# Patient Record
Sex: Male | Born: 1970 | Race: White | Hispanic: No | Marital: Married | Smoking: Current every day smoker
Health system: Southern US, Community
[De-identification: ages and names within clinical notes are randomized; demographics above are authoritative.]

## PROBLEM LIST (undated history)

## (undated) DIAGNOSIS — M549 Dorsalgia, unspecified: Secondary | ICD-10-CM

## (undated) DIAGNOSIS — R011 Cardiac murmur, unspecified: Secondary | ICD-10-CM

## (undated) HISTORY — PX: COLONOSCOPY: SHX174

---

## 2013-06-28 ENCOUNTER — Emergency Department (HOSPITAL_COMMUNITY): Payer: No Typology Code available for payment source

## 2013-06-28 ENCOUNTER — Inpatient Hospital Stay (HOSPITAL_COMMUNITY)
Admission: EM | Admit: 2013-06-28 | Discharge: 2013-07-01 | DRG: 419 | Disposition: A | Discharge status: Home / Self Care | Payer: No Typology Code available for payment source | Attending: General Surgery | Admitting: General Surgery

## 2013-06-28 ENCOUNTER — Encounter (HOSPITAL_COMMUNITY): Payer: Self-pay | Admitting: Emergency Medicine

## 2013-06-28 DIAGNOSIS — I77810 Thoracic aortic ectasia: Secondary | ICD-10-CM

## 2013-06-28 DIAGNOSIS — I341 Nonrheumatic mitral (valve) prolapse: Secondary | ICD-10-CM

## 2013-06-28 DIAGNOSIS — R110 Nausea: Secondary | ICD-10-CM

## 2013-06-28 DIAGNOSIS — R1011 Right upper quadrant pain: Secondary | ICD-10-CM

## 2013-06-28 DIAGNOSIS — R911 Solitary pulmonary nodule: Secondary | ICD-10-CM | POA: Diagnosis present

## 2013-06-28 DIAGNOSIS — F17200 Nicotine dependence, unspecified, uncomplicated: Secondary | ICD-10-CM | POA: Diagnosis present

## 2013-06-28 DIAGNOSIS — R197 Diarrhea, unspecified: Secondary | ICD-10-CM

## 2013-06-28 DIAGNOSIS — I059 Rheumatic mitral valve disease, unspecified: Secondary | ICD-10-CM | POA: Diagnosis present

## 2013-06-28 DIAGNOSIS — K810 Acute cholecystitis: Secondary | ICD-10-CM

## 2013-06-28 DIAGNOSIS — K8000 Calculus of gallbladder with acute cholecystitis without obstruction: Principal | ICD-10-CM | POA: Diagnosis present

## 2013-06-28 HISTORY — DX: Cardiac murmur, unspecified: R01.1

## 2013-06-28 HISTORY — DX: Dorsalgia, unspecified: M54.9

## 2013-06-28 LAB — COMPREHENSIVE METABOLIC PANEL
ALT: 18 U/L (ref 0–53)
AST: 16 U/L (ref 0–37)
Albumin: 4 g/dL (ref 3.5–5.2)
Alkaline Phosphatase: 105 U/L (ref 39–117)
BILIRUBIN TOTAL: 0.5 mg/dL (ref 0.3–1.2)
BUN: 11 mg/dL (ref 6–23)
CO2: 27 meq/L (ref 19–32)
CREATININE: 0.78 mg/dL (ref 0.50–1.35)
Calcium: 9.7 mg/dL (ref 8.4–10.5)
Chloride: 103 mEq/L (ref 96–112)
GFR calc Af Amer: >90 mL/min (ref >90)
GLUCOSE: 112 mg/dL — AB (ref 70–99)
Potassium: 4.1 mEq/L (ref 3.7–5.3)
Sodium: 142 mEq/L (ref 137–147)
Total Protein: 6.9 g/dL (ref 6.0–8.3)

## 2013-06-28 LAB — CBC WITH DIFFERENTIAL/PLATELET
BASOS ABS: 0.1 10*3/uL (ref 0.0–0.1)
Basophils Relative: 1 % (ref 0–1)
EOS PCT: 2 % (ref 0–5)
Eosinophils Absolute: 0.3 10*3/uL (ref 0.0–0.7)
HEMATOCRIT: 50.1 % (ref 39.0–52.0)
HEMOGLOBIN: 17.6 g/dL — AB (ref 13.0–17.0)
LYMPHS ABS: 2.5 10*3/uL (ref 0.7–4.0)
LYMPHS PCT: 20 % (ref 12–46)
MCH: 30.9 pg (ref 26.0–34.0)
MCHC: 35.1 g/dL (ref 30.0–36.0)
MCV: 88 fL (ref 78.0–100.0)
MONO ABS: 0.8 10*3/uL (ref 0.1–1.0)
Monocytes Relative: 7 % (ref 3–12)
Neutro Abs: 8.9 10*3/uL — ABNORMAL HIGH (ref 1.7–7.7)
Neutrophils Relative %: 70 % (ref 43–77)
Platelets: 283 10*3/uL (ref 150–400)
RBC: 5.69 MIL/uL (ref 4.22–5.81)
RDW: 13.8 % (ref 11.5–15.5)
WBC: 12.6 10*3/uL — AB (ref 4.0–10.5)

## 2013-06-28 LAB — I-STAT TROPONIN, ED: TROPONIN I, POC: 0 ng/mL (ref 0.00–0.08)

## 2013-06-28 LAB — LIPASE, BLOOD: Lipase: 43 U/L (ref 11–59)

## 2013-06-28 MED ORDER — MORPHINE SULFATE 4 MG/ML IJ SOLN
4.0000 mg | Freq: Once | INTRAMUSCULAR | Status: AC
  Administered 2013-06-28: 4 mg via INTRAVENOUS
  Filled 2013-06-28: qty 1

## 2013-06-28 MED ORDER — HYDROMORPHONE HCL PF 1 MG/ML IJ SOLN
1.0000 mg | Freq: Once | INTRAMUSCULAR | Status: AC
  Administered 2013-06-28: 1 mg via INTRAVENOUS
  Filled 2013-06-28: qty 1

## 2013-06-28 MED ORDER — PIPERACILLIN-TAZOBACTAM 3.375 G IVPB
3.3750 g | Freq: Three times a day (TID) | INTRAVENOUS | Status: DC
  Administered 2013-06-28 – 2013-07-01 (×9): 3.375 g via INTRAVENOUS
  Filled 2013-06-28 (×13): qty 50

## 2013-06-28 MED ORDER — HYDROMORPHONE HCL PF 1 MG/ML IJ SOLN
1.0000 mg | INTRAMUSCULAR | Status: DC | PRN
  Administered 2013-06-28 – 2013-06-30 (×12): 1 mg via INTRAVENOUS
  Filled 2013-06-28 (×12): qty 1

## 2013-06-28 MED ORDER — POTASSIUM CHLORIDE 2 MEQ/ML IV SOLN
INTRAVENOUS | Status: DC
  Administered 2013-06-28 – 2013-06-30 (×5): via INTRAVENOUS
  Filled 2013-06-28 (×11): qty 1000

## 2013-06-28 MED ORDER — SODIUM CHLORIDE 0.9 % IV BOLUS (SEPSIS)
1000.0000 mL | Freq: Once | INTRAVENOUS | Status: AC
  Administered 2013-06-28: 1000 mL via INTRAVENOUS

## 2013-06-28 MED ORDER — ONDANSETRON HCL 4 MG/2ML IJ SOLN
4.0000 mg | Freq: Four times a day (QID) | INTRAMUSCULAR | Status: DC | PRN
  Administered 2013-06-29 – 2013-06-30 (×2): 4 mg via INTRAVENOUS
  Filled 2013-06-28: qty 2

## 2013-06-28 MED ORDER — ONDANSETRON 8 MG/NS 50 ML IVPB
8.0000 mg | Freq: Once | INTRAVENOUS | Status: AC
  Administered 2013-06-28: 8 mg via INTRAVENOUS
  Filled 2013-06-28: qty 8

## 2013-06-28 MED ORDER — ONDANSETRON HCL 4 MG/2ML IJ SOLN
4.0000 mg | Freq: Once | INTRAMUSCULAR | Status: AC
  Administered 2013-06-28: 4 mg via INTRAVENOUS
  Filled 2013-06-28: qty 2

## 2013-06-28 NOTE — ED Notes (Signed)
General Surgery at bedside.

## 2013-06-28 NOTE — ED Provider Notes (Signed)
Medical screening examination/treatment/procedure(s) were performed by non-physician practitioner and as supervising physician I was immediately available for consultation/collaboration.   EKG Interpretation None        Layla MawKristen N Ward, DO 06/28/13 1553

## 2013-06-28 NOTE — ED Provider Notes (Signed)
CSN: [id number]     Arrival date & time 06/28/13  0900 History   First MD Initiated Contact with Patient 06/28/13 [phone]     Chief Complaint  Patient presents with  . Abdominal Pain     (Consider location/radiation/quality/duration/timing/severity/associated sxs/prior Treatment) HPI  Italyhad Oleta MouseLosh is a 43 y.o. male complaining of severe right upper quadrant pain onset at 5 AM woke him from sleep, + nausea with no vomiting. Patient has had several episodes of pain in this area in the last month however never this severe. Patient denies fever, chills, shortness of breath, has been passing gas and having normal stool, denies melena or hematochezia. Patient has had colonoscopy because he has had bleeding polyps in the past. States this pain may be similar to that. Last oral intake was a full meal at 11:30 PM last night.  No PCP: recently moved to town, has not yet established care.  Past Medical History  Diagnosis Date  . Heart murmur   . Back pain    History reviewed. No pertinent past surgical history. History reviewed. No pertinent family history. History  Substance Use Topics  . Smoking status: Current Every Day Smoker  . Smokeless tobacco: Not on file  . Alcohol Use: Yes    Review of Systems  10 systems reviewed and found to be negative, except as noted in the HPI.   Allergies  Review of patient's allergies indicates no known allergies.  Home Medications   Prior to Admission medications   Medication Sig Start Date End Date Taking? Authorizing Provider  gabapentin (NEURONTIN) 300 MG capsule Take 300 mg by mouth 2 (two) times daily as needed. For pain 04/07/13  Yes Historical Provider, MD  ibuprofen (ADVIL,MOTRIN) 600 MG tablet Take 600 mg by mouth 3 (three) times daily as needed. For pain 04/15/13  Yes Historical Provider, MD   BP 139/89  Pulse 74  Temp(Src) 97.9 F (36.6 C)  Resp 12  Wt 175 lb (79.379 kg)  SpO2 97% Physical Exam  Nursing note and vitals  reviewed. Constitutional: He is oriented to person, place, and time. He appears well-developed and well-nourished. He appears distressed.  HENT:  Head: Normocephalic and atraumatic.  Mouth/Throat: Oropharynx is clear and moist.  Eyes: Conjunctivae and EOM are normal. Pupils are equal, round, and reactive to light.  Neck: Normal range of motion.  Cardiovascular: Normal rate, regular rhythm and intact distal pulses.   Pulmonary/Chest: Effort normal and breath sounds normal. No stridor. No respiratory distress. He has no wheezes. He has no rales. He exhibits no tenderness.  Abdominal: Soft. He exhibits no distension and no mass. There is tenderness. There is guarding. There is no rebound.  Positive Murphy's, voluntary guarding, no rebound, reduced bowel sounds.  Musculoskeletal: Normal range of motion. He exhibits no edema.  Neurological: He is alert and oriented to person, place, and time.  Psychiatric: He has a normal mood and affect.    ED Course  Procedures (including critical care time) Labs Review Labs Reviewed  CBC WITH DIFFERENTIAL - Abnormal; Notable for the following:    WBC 12.6 (*)    Hemoglobin 17.6 (*)    Neutro Abs 8.9 (*)    All other components within normal limits  COMPREHENSIVE METABOLIC PANEL - Abnormal; Notable for the following:    Glucose, Bld 112 (*)    All other components within normal limits  LIPASE, BLOOD  I-STAT TROPOININ, ED    Imaging Review [place] Abdomen Complete  06/28/2013   CLINICAL DATA:  Abdominal pain.  EXAM: ULTRASOUND ABDOMEN COMPLETE  COMPARISON:  None.  FINDINGS: Gallbladder:  The gallbladder is distended by mid level echoes consistent with sludge. There are also admixed shadowing gallstones. No wall thickening or focal tenderness per sonographer exam.  Common bile duct:  Diameter: 6 mm.  Where visualized, no filling defect.  Liver:  No focal lesion identified. Within normal limits in parenchymal echogenicity.  IVC:  No abnormality visualized.   Pancreas:  Visualized portion unremarkable (the splenic vein does not fill with color Doppler, but neither does the neighboring aorta or left renal vein - consistent with a technical finding.  Spleen:  Upper limits of normal size with 13 cm length and 5 cm thickness. No focal abnormality.  Right Kidney:  Length: 13 cm. Echogenicity within normal limits. No mass or hydronephrosis visualized.  Left Kidney:  Length: 13 cm. Echogenicity within normal limits. No mass or hydronephrosis visualized.  Abdominal aorta:  No aneurysm visualized.  Other findings:  None.  IMPRESSION: Cholelithiasis and gallbladder distention. No focal tenderness or wall thickening to suggest acute cholecystitis.   Electronically Signed   By: Tiburcio PeaJonathan  Watts M.D.   On: 06/28/2013 11:01   Dg Chest Port 1 View  06/28/2013   CLINICAL DATA:  Shortness of breath.  EXAM: PORTABLE CHEST - 1 VIEW  COMPARISON:  None.  FINDINGS: Normal heart size and mediastinal contours. No acute infiltrate or edema. No effusion or pneumothorax. No acute osseous findings. There is mild levo curvature centered at the upper thoracic level, which may be positional.  IMPRESSION: No active disease.   Electronically Signed   By: Tiburcio PeaJonathan  Watts M.D.   On: 06/28/2013 12:22     EKG Interpretation None      MDM   Final diagnoses:  None    Filed Vitals:   06/28/13 1000 06/28/13 1115 06/28/13 1130 06/28/13 1221  BP: 149/81 144/89 144/89 139/89  Pulse: 67 61 71 74  Temp:      Resp:    12  Weight:      SpO2: 98% 100% 100% 97%    Medications  ondansetron (ZOFRAN) injection 4 mg (4 mg Intravenous Given 06/28/13 0949)  sodium chloride 0.9 % bolus 1,000 mL (0 mLs Intravenous Stopped 06/28/13 1115)  morphine 4 MG/ML injection 4 mg (4 mg Intravenous Given 06/28/13 0957)  HYDROmorphone (DILAUDID) injection 1 mg (1 mg Intravenous Given 06/28/13 1116)  ondansetron (ZOFRAN) 8 mg/NS 50 ml IVPB (8 mg Intravenous Given 06/28/13 1224)  HYDROmorphone (DILAUDID) injection 1  mg (1 mg Intravenous Given 06/28/13 1139)    Italyhad Skellenger is a 43 y.o. male presenting with severe right upper quadrant pain, had several severe exacerbations over the past few months. Likely gallbladder. Fluids, blood work and ultrasound ordered. Ultrasound shows gallstones a distended gallbladder with no wall thickening or pericholecystic fluid. Patient does have a white count. Pain is very difficult to control in the ED requiring multiple doses of Dilaudid. Surgery consult from Dr. Corliss Skainssuei appreciated: He will admit the patient's for possible surgery in the a.m. tomorrow.  Note: Portions of this report may have been transcribed using voice recognition software. Every effort was made to ensure accuracy; however, inadvertent computerized transcription errors may be present     Wynetta Emeryicole Hazelee Harbold, PA-C 06/28/13 1329

## 2013-06-28 NOTE — H&P (Signed)
Jonathan Carrillo is an 43 y.o. male.   Chief Complaint: RUQ abdominal pain, diarrhea, nausea HPI: This is a healthy 43 yo male who is in the process of transitioning to [facility] from [place], who presents with acute onset of RUQ abdominal pain, nausea, diarrhea since 0500 today.  He has had similar, but milder episodes over the last several months.  No strict relationship to eating.  This is the worst episode, by far.    Past Medical History  Diagnosis Date  . Heart murmur   . Back pain     History reviewed. No pertinent past surgical history.  History reviewed. No pertinent family history. Social History:  reports that he has been smoking.  He does not have any smokeless tobacco history on file. He reports that he drinks alcohol. His drug history is not on file.  Allergies: No Known Allergies  Prior to Admission medications   Medication Sig Start Date End Date Taking? Authorizing Provider  gabapentin (NEURONTIN) 300 MG capsule Take 300 mg by mouth 2 (two) times daily as needed. For pain 04/07/13  Yes Historical Provider, MD  ibuprofen (ADVIL,MOTRIN) 600 MG tablet Take 600 mg by mouth 3 (three) times daily as needed. For pain 04/15/13  Yes Historical Provider, MD     Results for orders placed during the hospital encounter of 06/28/13 (from the past 48 hour(s))  CBC WITH DIFFERENTIAL     Status: Abnormal   Collection Time    06/28/13  9:20 AM      Result Value Ref Range   WBC 12.6 (*) 4.0 - 10.5 K/uL   RBC 5.69  4.22 - 5.81 MIL/uL   Hemoglobin 17.6 (*) 13.0 - 17.0 g/dL   HCT 50.1  39.0 - 52.0 %   MCV 88.0  78.0 - 100.0 fL   MCH 30.9  26.0 - 34.0 pg   MCHC 35.1  30.0 - 36.0 g/dL   RDW 13.8  11.5 - 15.5 %   Platelets 283  150 - 400 K/uL   Neutrophils Relative % 70  43 - 77 %   Neutro Abs 8.9 (*) 1.7 - 7.7 K/uL   Lymphocytes Relative 20  12 - 46 %   Lymphs Abs 2.5  0.7 - 4.0 K/uL   Monocytes Relative 7  3 - 12 %   Monocytes Absolute 0.8  0.1 - 1.0 K/uL   Eosinophils Relative 2  0 -  5 %   Eosinophils Absolute 0.3  0.0 - 0.7 K/uL   Basophils Relative 1  0 - 1 %   Basophils Absolute 0.1  0.0 - 0.1 K/uL  COMPREHENSIVE METABOLIC PANEL     Status: Abnormal   Collection Time    06/28/13  9:20 AM      Result Value Ref Range   Sodium 142  137 - 147 mEq/L   Potassium 4.1  3.7 - 5.3 mEq/L   Chloride 103  96 - 112 mEq/L   CO2 27  19 - 32 mEq/L   Glucose, Bld 112 (*) 70 - 99 mg/dL   BUN 11  6 - 23 mg/dL   Creatinine, Ser 0.78  0.50 - 1.35 mg/dL   Calcium 9.7  8.4 - 10.5 mg/dL   Total Protein 6.9  6.0 - 8.3 g/dL   Albumin 4.0  3.5 - 5.2 g/dL   AST 16  0 - 37 U/L   ALT 18  0 - 53 U/L   Alkaline Phosphatase 105  39 - 117 U/L  Total Bilirubin 0.5  0.3 - 1.2 mg/dL   GFR calc non Af Amer >90  >90 mL/min   GFR calc Af Amer >90  >90 mL/min   Comment: (NOTE)     The eGFR has been calculated using the CKD EPI equation.     This calculation has not been validated in all clinical situations.     eGFR's persistently <90 mL/min signify possible Chronic Kidney     Disease.  LIPASE, BLOOD     Status: None   Collection Time    06/28/13  9:20 AM      Result Value Ref Range   Lipase 43  11 - 59 U/L  I-STAT TROPOININ, ED     Status: None   Collection Time    06/28/13 12:26 PM      Result Value Ref Range   Troponin i, poc 0.00  0.00 - 0.08 ng/mL   Comment 3            Comment: Due to the release kinetics of cTnI,     a negative result within the first hours     of the onset of symptoms does not rule out     myocardial infarction with certainty.     If myocardial infarction is still suspected,     repeat the test at appropriate intervals.   US Abdomen Complete  06/28/2013   CLINICAL DATA:  Abdominal pain.  EXAM: ULTRASOUND ABDOMEN COMPLETE  COMPARISON:  None.  FINDINGS: Gallbladder:  The gallbladder is distended by mid level echoes consistent with sludge. There are also admixed shadowing gallstones. No wall thickening or focal tenderness per sonographer exam.  Common bile duct:   Diameter: 6 mm.  Where visualized, no filling defect.  Liver:  No focal lesion identified. Within normal limits in parenchymal echogenicity.  IVC:  No abnormality visualized.  Pancreas:  Visualized portion unremarkable (the splenic vein does not fill with color Doppler, but neither does the neighboring aorta or left renal vein - consistent with a technical finding.  Spleen:  Upper limits of normal size with 13 cm length and 5 cm thickness. No focal abnormality.  Right Kidney:  Length: 13 cm. Echogenicity within normal limits. No mass or hydronephrosis visualized.  Left Kidney:  Length: 13 cm. Echogenicity within normal limits. No mass or hydronephrosis visualized.  Abdominal aorta:  No aneurysm visualized.  Other findings:  None.  IMPRESSION: Cholelithiasis and gallbladder distention. No focal tenderness or wall thickening to suggest acute cholecystitis.   Electronically Signed   By: Jorje Guild M.D.   On: 06/28/2013 11:01   Dg Chest Port 1 View  06/28/2013   CLINICAL DATA:  Shortness of breath.  EXAM: PORTABLE CHEST - 1 VIEW  COMPARISON:  None.  FINDINGS: Normal heart size and mediastinal contours. No acute infiltrate or edema. No effusion or pneumothorax. No acute osseous findings. There is mild levo curvature centered at the upper thoracic level, which may be positional.  IMPRESSION: No active disease.   Electronically Signed   By: Jorje Guild M.D.   On: 06/28/2013 12:22    Review of Systems  Constitutional: Negative for weight loss.  HENT: Negative for ear discharge, ear pain, hearing loss and tinnitus.   Eyes: Negative.  Negative for blurred vision, double vision, photophobia and pain.  Respiratory: Negative for cough, sputum production and shortness of breath.   Cardiovascular: Negative for chest pain.  Gastrointestinal: Positive for nausea, abdominal pain and diarrhea. Negative for vomiting.  Genitourinary: Negative  for dysuria, urgency, frequency and flank pain.  Musculoskeletal:  Negative for back pain, falls, joint pain, myalgias and neck pain.  Neurological: Negative.  Negative for dizziness, tingling, sensory change, focal weakness, loss of consciousness and headaches.  Endo/Heme/Allergies: Negative.  Does not bruise/bleed easily.  Psychiatric/Behavioral: Negative for depression, memory loss and substance abuse. The patient is not nervous/anxious.     Blood pressure 139/89, pulse 74, temperature 97.9 F (36.6 C), resp. rate 12, weight 175 lb (79.379 kg), SpO2 97.00%. Physical Exam  WDWN in NAD HEENT:  EOMI, sclera anicteric Neck:  No masses, no thyromegaly Lungs:  CTA bilaterally; normal respiratory effort CV:  Regular rate and rhythm; no murmurs Abd:  +bowel sounds, soft, tender RUQ just below costal margin Ext:  Well-perfused; no edema Skin:  Warm, dry; no sign of jaundice  Assessment/Plan Acute calculus cholecystitis  Admit for IV antibiotics/ IV hydration Laparoscopic cholecystectomy probably in AM.  Discussed with the patient.   Imogene Burn. Amely Voorheis 06/28/2013, 12:39 PM

## 2013-06-28 NOTE — ED Notes (Signed)
Per pt sts for the past few months he has been having RUQ pain intermittently but at 5 am he began having severe pain and nausea.

## 2013-06-29 ENCOUNTER — Observation Stay (HOSPITAL_COMMUNITY): Payer: No Typology Code available for payment source

## 2013-06-29 ENCOUNTER — Observation Stay (HOSPITAL_COMMUNITY): Payer: No Typology Code available for payment source | Admitting: Anesthesiology

## 2013-06-29 ENCOUNTER — Encounter (HOSPITAL_COMMUNITY): Payer: No Typology Code available for payment source | Admitting: Anesthesiology

## 2013-06-29 ENCOUNTER — Encounter (HOSPITAL_COMMUNITY): Payer: Self-pay | Admitting: General Surgery

## 2013-06-29 ENCOUNTER — Encounter (HOSPITAL_COMMUNITY): Admission: EM | Disposition: A | Discharge status: Home / Self Care | Payer: Self-pay | Source: Home / Self Care

## 2013-06-29 DIAGNOSIS — K8010 Calculus of gallbladder with chronic cholecystitis without obstruction: Secondary | ICD-10-CM

## 2013-06-29 HISTORY — PX: CHOLECYSTECTOMY: SHX55

## 2013-06-29 LAB — CBC
HCT: 45.1 % (ref 39.0–52.0)
HEMOGLOBIN: 15.5 g/dL (ref 13.0–17.0)
MCH: 30.5 pg (ref 26.0–34.0)
MCHC: 34.4 g/dL (ref 30.0–36.0)
MCV: 88.6 fL (ref 78.0–100.0)
Platelets: 230 10*3/uL (ref 150–400)
RBC: 5.09 MIL/uL (ref 4.22–5.81)
RDW: 13.7 % (ref 11.5–15.5)
WBC: 8.9 10*3/uL (ref 4.0–10.5)

## 2013-06-29 LAB — COMPREHENSIVE METABOLIC PANEL
ALK PHOS: 93 U/L (ref 39–117)
ALT: 16 U/L (ref 0–53)
AST: 17 U/L (ref 0–37)
Albumin: 3.4 g/dL — ABNORMAL LOW (ref 3.5–5.2)
BUN: 6 mg/dL (ref 6–23)
CO2: 26 mEq/L (ref 19–32)
Calcium: 9 mg/dL (ref 8.4–10.5)
Chloride: 103 mEq/L (ref 96–112)
Creatinine, Ser: 0.8 mg/dL (ref 0.50–1.35)
GFR calc non Af Amer: >90 mL/min (ref >90)
GLUCOSE: 104 mg/dL — AB (ref 70–99)
Potassium: 4 mEq/L (ref 3.7–5.3)
Sodium: 141 mEq/L (ref 137–147)
Total Bilirubin: 1.4 mg/dL — ABNORMAL HIGH (ref 0.3–1.2)
Total Protein: 5.9 g/dL — ABNORMAL LOW (ref 6.0–8.3)

## 2013-06-29 LAB — SURGICAL PCR SCREEN
MRSA, PCR: NEGATIVE
STAPHYLOCOCCUS AUREUS: POSITIVE — AB

## 2013-06-29 SURGERY — LAPAROSCOPIC CHOLECYSTECTOMY WITH INTRAOPERATIVE CHOLANGIOGRAM
Anesthesia: General | Site: Abdomen

## 2013-06-29 MED ORDER — BUPIVACAINE-EPINEPHRINE (PF) 0.25% -1:200000 IJ SOLN
INTRAMUSCULAR | Status: AC
  Filled 2013-06-29: qty 30

## 2013-06-29 MED ORDER — SODIUM CHLORIDE 0.9 % IR SOLN
Status: DC | PRN
  Administered 2013-06-29: 3000 mL

## 2013-06-29 MED ORDER — LIDOCAINE HCL (CARDIAC) 20 MG/ML IV SOLN
INTRAVENOUS | Status: AC
  Filled 2013-06-29: qty 5

## 2013-06-29 MED ORDER — MIDAZOLAM HCL 2 MG/2ML IJ SOLN
INTRAMUSCULAR | Status: DC | PRN
  Administered 2013-06-29: 2 mg via INTRAVENOUS

## 2013-06-29 MED ORDER — MIDAZOLAM HCL 2 MG/2ML IJ SOLN
INTRAMUSCULAR | Status: AC
  Filled 2013-06-29: qty 2

## 2013-06-29 MED ORDER — 0.9 % SODIUM CHLORIDE (POUR BTL) OPTIME
TOPICAL | Status: DC | PRN
  Administered 2013-06-29: 1000 mL

## 2013-06-29 MED ORDER — LABETALOL HCL 5 MG/ML IV SOLN
INTRAVENOUS | Status: AC
  Filled 2013-06-29: qty 4

## 2013-06-29 MED ORDER — SUCCINYLCHOLINE CHLORIDE 20 MG/ML IJ SOLN
INTRAMUSCULAR | Status: AC
  Filled 2013-06-29: qty 1

## 2013-06-29 MED ORDER — LACTATED RINGERS IV SOLN
INTRAVENOUS | Status: DC | PRN
  Administered 2013-06-29 (×2): via INTRAVENOUS

## 2013-06-29 MED ORDER — MEPERIDINE HCL 25 MG/ML IJ SOLN: 6.2500 mg | INTRAMUSCULAR | Status: DC | PRN

## 2013-06-29 MED ORDER — OXYCODONE HCL 5 MG/5ML PO SOLN: 5.0000 mg | Freq: Once | ORAL | Status: DC | PRN

## 2013-06-29 MED ORDER — PROPOFOL 10 MG/ML IV BOLUS
INTRAVENOUS | Status: DC | PRN
  Administered 2013-06-29: 200 mg via INTRAVENOUS

## 2013-06-29 MED ORDER — MUPIROCIN 2 % EX OINT
1.0000 "application " | TOPICAL_OINTMENT | Freq: Two times a day (BID) | CUTANEOUS | Status: DC
  Administered 2013-06-29 – 2013-07-01 (×4): 1 via NASAL
  Filled 2013-06-29 (×2): qty 22

## 2013-06-29 MED ORDER — IOHEXOL 300 MG/ML  SOLN
INTRAMUSCULAR | Status: DC | PRN
  Administered 2013-06-29: 13:00:00

## 2013-06-29 MED ORDER — ACETAMINOPHEN 10 MG/ML IV SOLN
1000.0000 mg | Freq: Four times a day (QID) | INTRAVENOUS | Status: AC
  Administered 2013-06-29: 1000 mg via INTRAVENOUS
  Filled 2013-06-29 (×3): qty 100

## 2013-06-29 MED ORDER — HYDROMORPHONE HCL PF 1 MG/ML IJ SOLN
INTRAMUSCULAR | Status: AC
  Filled 2013-06-29: qty 1

## 2013-06-29 MED ORDER — PROMETHAZINE HCL 25 MG/ML IJ SOLN: 6.2500 mg | INTRAMUSCULAR | Status: DC | PRN

## 2013-06-29 MED ORDER — HYDROMORPHONE HCL PF 1 MG/ML IJ SOLN
0.2500 mg | INTRAMUSCULAR | Status: DC | PRN
  Administered 2013-06-29 (×4): 0.5 mg via INTRAVENOUS

## 2013-06-29 MED ORDER — ACETAMINOPHEN 10 MG/ML IV SOLN
1000.0000 mg | Freq: Once | INTRAVENOUS | Status: DC
  Administered 2013-06-29: 1000 mg via INTRAVENOUS

## 2013-06-29 MED ORDER — ROCURONIUM BROMIDE 50 MG/5ML IV SOLN
INTRAVENOUS | Status: AC
  Filled 2013-06-29: qty 1

## 2013-06-29 MED ORDER — SODIUM CHLORIDE 0.9 % IR SOLN
Status: DC | PRN
  Administered 2013-06-29: 1000 mL

## 2013-06-29 MED ORDER — PROPOFOL 10 MG/ML IV BOLUS
INTRAVENOUS | Status: AC
  Filled 2013-06-29: qty 20

## 2013-06-29 MED ORDER — MIDAZOLAM HCL 2 MG/2ML IJ SOLN: 0.5000 mg | Freq: Once | INTRAMUSCULAR | Status: DC | PRN

## 2013-06-29 MED ORDER — CHLORHEXIDINE GLUCONATE CLOTH 2 % EX PADS
6.0000 | MEDICATED_PAD | Freq: Every day | CUTANEOUS | Status: DC
  Administered 2013-06-30 – 2013-07-01 (×2): 6 via TOPICAL

## 2013-06-29 MED ORDER — LABETALOL HCL 5 MG/ML IV SOLN
INTRAVENOUS | Status: DC | PRN
Start: 2013-06-29 — End: 2013-06-29
  Administered 2013-06-29: 5 mg via INTRAVENOUS

## 2013-06-29 MED ORDER — SUCCINYLCHOLINE CHLORIDE 20 MG/ML IJ SOLN
INTRAMUSCULAR | Status: DC | PRN
  Administered 2013-06-29: 140 mg via INTRAVENOUS

## 2013-06-29 MED ORDER — LIDOCAINE HCL (PF) 1 % IJ SOLN
INTRAMUSCULAR | Status: AC
  Filled 2013-06-29: qty 30

## 2013-06-29 MED ORDER — OXYCODONE HCL 5 MG PO TABS: 5.0000 mg | ORAL_TABLET | Freq: Once | ORAL | Status: DC | PRN

## 2013-06-29 MED ORDER — FENTANYL CITRATE 0.05 MG/ML IJ SOLN
INTRAMUSCULAR | Status: DC | PRN
  Administered 2013-06-29: 100 ug via INTRAVENOUS
  Administered 2013-06-29: 50 ug via INTRAVENOUS
  Administered 2013-06-29: 100 ug via INTRAVENOUS

## 2013-06-29 MED ORDER — FENTANYL CITRATE 0.05 MG/ML IJ SOLN
INTRAMUSCULAR | Status: AC
  Filled 2013-06-29: qty 5

## 2013-06-29 MED ORDER — ONDANSETRON HCL 4 MG/2ML IJ SOLN
INTRAMUSCULAR | Status: AC
  Filled 2013-06-29: qty 2

## 2013-06-29 MED ORDER — LIDOCAINE HCL 1 % IJ SOLN
INTRAMUSCULAR | Status: DC | PRN
  Administered 2013-06-29: 13:00:00 via INTRAMUSCULAR

## 2013-06-29 MED ORDER — ROCURONIUM BROMIDE 100 MG/10ML IV SOLN
INTRAVENOUS | Status: DC | PRN
  Administered 2013-06-29: 25 mg via INTRAVENOUS

## 2013-06-29 SURGICAL SUPPLY — 39 items
APPLIER CLIP ROT 10 11.4 M/L (STAPLE) ×6
BLADE SURG ROTATE 9660 (MISCELLANEOUS) IMPLANT
CANISTER SUCTION 2500CC (MISCELLANEOUS) ×3 IMPLANT
CHLORAPREP W/TINT 26ML (MISCELLANEOUS) ×3 IMPLANT
CLIP APPLIE ROT 10 11.4 M/L (STAPLE) ×2 IMPLANT
COVER MAYO STAND STRL (DRAPES) ×3 IMPLANT
COVER SURGICAL LIGHT HANDLE (MISCELLANEOUS) ×3 IMPLANT
DECANTER SPIKE VIAL GLASS SM (MISCELLANEOUS) ×6 IMPLANT
DERMABOND ADVANCED (GAUZE/BANDAGES/DRESSINGS) ×2
DERMABOND ADVANCED .7 DNX12 (GAUZE/BANDAGES/DRESSINGS) ×1 IMPLANT
DRAPE C-ARM 42X72 X-RAY (DRAPES) ×3 IMPLANT
DRAPE UTILITY 15X26 W/TAPE STR (DRAPE) ×6 IMPLANT
DRAPE WARM FLUID 44X44 (DRAPE) ×3 IMPLANT
ELECT REM PT RETURN 9FT ADLT (ELECTROSURGICAL) ×3
ELECTRODE REM PT RTRN 9FT ADLT (ELECTROSURGICAL) ×1 IMPLANT
FILTER SMOKE EVAC LAPAROSHD (FILTER) IMPLANT
GLOVE BIO SURGEON STRL SZ 6 (GLOVE) ×6 IMPLANT
GLOVE BIOGEL PI IND STRL 6.5 (GLOVE) ×1 IMPLANT
GLOVE BIOGEL PI INDICATOR 6.5 (GLOVE) ×2
GOWN STRL REUS W/ TWL LRG LVL3 (GOWN DISPOSABLE) ×3 IMPLANT
GOWN STRL REUS W/TWL 2XL LVL3 (GOWN DISPOSABLE) ×3 IMPLANT
GOWN STRL REUS W/TWL LRG LVL3 (GOWN DISPOSABLE) ×6
KIT BASIN OR (CUSTOM PROCEDURE TRAY) ×3 IMPLANT
KIT ROOM TURNOVER OR (KITS) ×3 IMPLANT
NS IRRIG 1000ML POUR BTL (IV SOLUTION) ×3 IMPLANT
PAD ARMBOARD 7.5X6 YLW CONV (MISCELLANEOUS) ×3 IMPLANT
POUCH SPECIMEN RETRIEVAL 10MM (ENDOMECHANICALS) ×3 IMPLANT
SCISSORS LAP 5X35 DISP (ENDOMECHANICALS) ×3 IMPLANT
SET CHOLANGIOGRAPH 5 50 .035 (SET/KITS/TRAYS/PACK) ×3 IMPLANT
SET IRRIG TUBING LAPAROSCOPIC (IRRIGATION / IRRIGATOR) ×3 IMPLANT
SLEEVE ENDOPATH XCEL 5M (ENDOMECHANICALS) ×3 IMPLANT
SPECIMEN JAR SMALL (MISCELLANEOUS) ×3 IMPLANT
SUT MNCRL AB 4-0 PS2 18 (SUTURE) ×3 IMPLANT
TOWEL OR 17X24 6PK STRL BLUE (TOWEL DISPOSABLE) ×3 IMPLANT
TOWEL OR 17X26 10 PK STRL BLUE (TOWEL DISPOSABLE) ×3 IMPLANT
TRAY LAPAROSCOPIC (CUSTOM PROCEDURE TRAY) ×3 IMPLANT
TROCAR XCEL BLUNT TIP 100MML (ENDOMECHANICALS) ×3 IMPLANT
TROCAR XCEL NON-BLD 11X100MML (ENDOMECHANICALS) ×3 IMPLANT
TROCAR XCEL NON-BLD 5MMX100MML (ENDOMECHANICALS) ×3 IMPLANT

## 2013-06-29 NOTE — Anesthesia Preprocedure Evaluation (Addendum)
Anesthesia Evaluation  Patient identified by MRN, date of birth, ID band Patient awake    Reviewed: Allergy & Precautions, H&P , NPO status , Patient's Chart, lab work & pertinent test results  History of Anesthesia Complications Negative for: history of anesthetic complications  Airway Mallampati: I TM Distance: >3 FB Neck ROM: Full    Dental  (+) Edentulous Upper, Caps, Dental Advisory Given   Pulmonary Current Smoker,  breath sounds clear to auscultation        Cardiovascular - angina+ Valvular Problems/Murmurs (remote ECHO) MVP Rhythm:Regular Rate:Normal + Systolic murmurs (III/VI)    Neuro/Psych Chronic back pain: h/o narcotics, not presently    GI/Hepatic Neg liver ROS, GERD-  Poorly Controlled,Acute chole   Endo/Other  negative endocrine ROS  Renal/GU negative Renal ROS     Musculoskeletal   Abdominal   Peds  Hematology negative hematology ROS (+)   Anesthesia Other Findings   Reproductive/Obstetrics                        Anesthesia Physical Anesthesia Plan  ASA: II  Anesthesia Plan: General   Post-op Pain Management:    Induction: Intravenous  Airway Management Planned: Oral ETT  Additional Equipment:   Intra-op Plan:   Post-operative Plan: Extubation in OR  Informed Consent: I have reviewed the patients History and Physical, chart, labs and discussed the procedure including the risks, benefits and alternatives for the proposed anesthesia with the patient or authorized representative who has indicated his/her understanding and acceptance.   Dental advisory given  Plan Discussed with: CRNA and Surgeon  Anesthesia Plan Comments: (Plan routine monitors, GETA)        Anesthesia Quick Evaluation

## 2013-06-29 NOTE — Anesthesia Procedure Notes (Signed)
Procedure Name: Intubation Date/Time: 06/29/2013 12:00 PM Performed by: Alanda AmassFRIEDMAN, Andrea Ferrer A Pre-anesthesia Checklist: Patient identified, Timeout performed, Emergency Drugs available, Patient being monitored and Suction available Patient Re-evaluated:Patient Re-evaluated prior to inductionOxygen Delivery Method: Circle system utilized Preoxygenation: Pre-oxygenation with 100% oxygen Intubation Type: IV induction, Rapid sequence and Cricoid Pressure applied Laryngoscope Size: Mac and 3 Grade View: Grade I Tube type: Oral Tube size: 7.5 mm Number of attempts: 1 Airway Equipment and Method: Stylet Placement Confirmation: ETT inserted through vocal cords under direct vision,  breath sounds checked- equal and bilateral and positive ETCO2 Secured at: 21 cm Tube secured with: Tape Dental Injury: Teeth and Oropharynx as per pre-operative assessment

## 2013-06-29 NOTE — Anesthesia Postprocedure Evaluation (Signed)
  Anesthesia Post-op Note  Patient: Jonathan Carrillo  Procedure(s) Performed: Procedure(s): LAPAROSCOPIC CHOLECYSTECTOMY WITH POSSIBLE INTRAOPERATIVE CHOLANGIOGRAM (N/A)  Patient Location: PACU  Anesthesia Type:General  Level of Consciousness: awake, alert , oriented and patient cooperative  Airway and Oxygen Therapy: Patient Spontanous Breathing  Post-op Pain: mild  Post-op Assessment: Post-op Vital signs reviewed, Patient's Cardiovascular Status Stable, Respiratory Function Stable, Patent Airway, No signs of Nausea or vomiting and Pain level controlled  Post-op Vital Signs: Reviewed and stable  Last Vitals:  Filed Vitals:   06/29/13 1014  BP: 113/74  Pulse: 60  Temp: 36.6 C  Resp: 16    Complications: No apparent anesthesia complications

## 2013-06-29 NOTE — Op Note (Signed)
Laparoscopic Cholecystectomy with IOC Procedure Note  Indications: This patient presents with acute cholecystitis and will undergo laparoscopic cholecystectomy.  Pre-operative Diagnosis: acute cholecystitis with calculus  Post-operative Diagnosis: Same  Surgeon: Almond LintBYERLY,Effie Wahlert   Assistants: CORNETT, TOM  Anesthesia: General endotracheal anesthesia and local  ASA Class: 2  Procedure Details  The patient was seen again in the Holding Room. The risks, benefits, complications, treatment options, and expected outcomes were discussed with the patient. The possibilities of  bleeding, recurrent infection, damage to nearby structures, the need for additional procedures, failure to diagnose a condition, the possible need to convert to an open procedure, and creating a complication requiring transfusion or operation were discussed with the patient. The likelihood of improving the patient's symptoms with return to their baseline status is good.    The patient and/or family concurred with the proposed plan, giving informed consent. The site of surgery properly noted. The patient was taken to Operating Room, and the procedure verified as Laparoscopic Cholecystectomy with Intraoperative Cholangiogram. A Time Out was held and the above information confirmed.  Prior to the induction of general anesthesia, antibiotic prophylaxis was administered. General endotracheal anesthesia was then administered and tolerated well. After the induction, the abdomen was prepped with Chloraprep and draped in the sterile fashion. The patient was positioned in the supine position.  Local anesthetic agent was injected into the skin near the umbilicus and an incision made. We dissected down to the abdominal fascia with blunt dissection.  The fascia was incised vertically and we entered the peritoneal cavity bluntly.  A pursestring suture of 0-Vicryl was placed around the fascial opening.  The Hasson cannula was inserted and secured  with the stay suture.  Pneumoperitoneum was then created with CO2 and tolerated well without any adverse changes in the patient's vital signs. An 11-mm port was placed in the subxiphoid position.  Two 5-mm ports were placed in the right upper quadrant. All skin incisions were infiltrated with a local anesthetic agent before making the incision and placing the trocars.   We positioned the patient in reverse Trendelenburg, tilted slightly to the patient's left.  The gallbladder was identified, the fundus grasped and retracted cephalad. Adhesions were lysed bluntly and with the electrocautery where indicated, taking care not to injure any adjacent organs or viscus. The infundibulum was grasped and retracted laterally, exposing the peritoneum overlying the triangle of Calot. This was then divided and exposed in a blunt fashion. A critical view of the cystic duct and cystic artery was obtained.  The cystic duct was clearly identified and bluntly dissected circumferentially. The cystic duct was ligated with a clip distally.   An incision was made in the cystic duct and the [facility] chol[facility] catheter introduced. The catheter was secured using a clip. A cholangiogram was then performed, demonstrating good filling of left and right hepatic ducts and no filling defects going into the duodenum.  The cystic duct was then ligated with clips and divided. The cystic artery was identified, dissected free, ligated with clips and divided as well.   The gallbladder was dissected from the liver bed in retrograde fashion with the electrocautery. The gallbladder was removed and placed in an Endocatch bag.  The gallbladder and Endocatch bag were then removed through the umbilical port site.  The liver bed was irrigated and inspected. There were numerous bridging vessels between the gallbladder and the liver.  There was significant oozing that was controlled with the cautery.  SNOW hemostatic agent was placed in the gallbladder  fossa.   Copious irrigation was utilized and was repeatedly aspirated until clear.    We again inspected the right upper quadrant for hemostasis.  Pneumoperitoneum was released as we removed the trocars.   The pursestring suture was used to close the umbilical fascia.  4-0 Monocryl was used to close the skin.   The skin was cleaned and dry, and Dermabond was applied. The patient was then extubated and brought to the recovery room in stable condition. Instrument, sponge, and needle counts were correct at closure and at the conclusion of the case.   Findings: Large dilated gallbladder.  Very thick bile.  Acutely inflamed.    Estimated Blood Loss: 100         Drains: none          Specimens: Gallbladder to pathology       Complications: None; patient tolerated the procedure well.         Disposition: PACU - hemodynamically stable.         Condition: stable

## 2013-06-29 NOTE — Progress Notes (Signed)
Day of Surgery  Subjective: NO CHANGES OVERNIGHT.  ruq PAIN AND BACK PAIN  Objective: Vital signs in last 24 hours: Temp:  [97.6 F (36.4 C)-98.1 F (36.7 C)] 97.6 F (36.4 C) (05/17 0554) Pulse Rate:  [61-80] 69 (05/17 0554) Resp:  [10-24] 18 (05/17 0554) BP: (121-149)/(70-93) 126/74 mmHg (05/17 0554) SpO2:  [95 %-100 %] 95 % (05/17 0554) Weight:  [175 lb (79.379 kg)] 175 lb (79.379 kg) (05/16 1414) Last BM Date: 06/28/13  Intake/Output from previous day: 05/16 0701 - 05/17 0700 In: 1835.4 [I.V.:835.4; IV Piggyback:1000] Out: 800 [Urine:800] Intake/Output this shift:    GI: TENDER ruq  Lab Results:   Recent Labs  06/28/13 0920 06/29/13 0454  WBC 12.6* 8.9  HGB 17.6* 15.5  HCT 50.1 45.1  PLT 283 230   BMET  Recent Labs  06/28/13 0920 06/29/13 0454  NA 142 141  K 4.1 4.0  CL 103 103  CO2 27 26  GLUCOSE 112* 104*  BUN 11 6  CREATININE 0.78 0.80  CALCIUM 9.7 9.0   PT/INR No results found for this basename: LABPROT, INR,  in the last 72 hours ABG No results found for this basename: PHART, PCO2, PO2, HCO3,  in the last 72 hours  Studies/Results: [place] Abdomen Complete  06/28/2013   CLINICAL DATA:  Abdominal pain.  EXAM: ULTRASOUND ABDOMEN COMPLETE  COMPARISON:  None.  FINDINGS: Gallbladder:  The gallbladder is distended by mid level echoes consistent with sludge. There are also admixed shadowing gallstones. No wall thickening or focal tenderness per sonographer exam.  Common bile duct:  Diameter: 6 mm.  Where visualized, no filling defect.  Liver:  No focal lesion identified. Within normal limits in parenchymal echogenicity.  IVC:  No abnormality visualized.  Pancreas:  Visualized portion unremarkable (the splenic vein does not fill with color Doppler, but neither does the neighboring aorta or left renal vein - consistent with a technical finding.  Spleen:  Upper limits of normal size with 13 cm length and 5 cm thickness. No focal abnormality.  Right Kidney:   Length: 13 cm. Echogenicity within normal limits. No mass or hydronephrosis visualized.  Left Kidney:  Length: 13 cm. Echogenicity within normal limits. No mass or hydronephrosis visualized.  Abdominal aorta:  No aneurysm visualized.  Other findings:  None.  IMPRESSION: Cholelithiasis and gallbladder distention. No focal tenderness or wall thickening to suggest acute cholecystitis.   Electronically Signed   By: Tiburcio PeaJonathan  Watts M.D.   On: 06/28/2013 11:01   Dg Chest Port 1 View  06/28/2013   CLINICAL DATA:  Shortness of breath.  EXAM: PORTABLE CHEST - 1 VIEW  COMPARISON:  None.  FINDINGS: Normal heart size and mediastinal contours. No acute infiltrate or edema. No effusion or pneumothorax. No acute osseous findings. There is mild levo curvature centered at the upper thoracic level, which may be positional.  IMPRESSION: No active disease.   Electronically Signed   By: Tiburcio PeaJonathan  Watts M.D.   On: 06/28/2013 12:22    Anti-infectives: Anti-infectives   Start     Dose/Rate Route Frequency Ordered Stop   06/28/13 1500  piperacillin-tazobactam (ZOSYN) IVPB 3.375 g     3.375 g 12.5 mL/hr over 240 Minutes Intravenous 3 times per day 06/28/13 1408        Assessment/Plan:  Acute cholecystitis Per Dr Donell BeersByerly for LGB/IOC The procedure has been discussed with the patient. Operative and non operative treatments have been discussed. Risks of surgery include bleeding, infection,  Common bile duct injury,  Injury to the stomach,liver, colon,small intestine, abdominal wall,  Diaphragm,  Major blood vessels,  And the need for an open procedure.  Other risks include worsening of medical problems, death,  DVT and pulmonary embolism, and cardiovascular events.   Medical options have also been discussed. The patient has been informed of long term expectations of surgery and non surgical options,  The patient agrees to proceed.    LOS: 1 day    Jeanmarc Viernes A. Juanetta Negash 06/29/2013

## 2013-06-29 NOTE — Interval H&P Note (Signed)
History and Physical Interval Note:  06/29/2013 11:18 AM  Jonathan Carrillo  has presented today for surgery, with the diagnosis of CHOLECYSTITIS  The various methods of treatment have been discussed with the patient and family. After consideration of risks, benefits and other options for treatment, the patient has consented to  Procedure(s): LAPAROSCOPIC CHOLECYSTECTOMY WITH POSSIBLE INTRAOPERATIVE CHOLANGIOGRAM (N/A) as a surgical intervention .  The patient's history has been reviewed, patient examined, no change in status, stable for surgery.  I have reviewed the patient's chart and labs.  Questions were answered to the patient's satisfaction.     Almond LintFaera Taje Littler

## 2013-06-29 NOTE — Transfer of Care (Signed)
Immediate Anesthesia Transfer of Care Note  Patient: Jonathan Carrillo  Procedure(s) Performed: Procedure(s): LAPAROSCOPIC CHOLECYSTECTOMY WITH POSSIBLE INTRAOPERATIVE CHOLANGIOGRAM (N/A)  Patient Location: PACU  Anesthesia Type:General  Level of Consciousness: awake  Airway & Oxygen Therapy: Patient Spontanous Breathing and Patient connected to nasal cannula oxygen  Post-op Assessment: Report given to PACU RN and Post -op Vital signs reviewed and stable  Post vital signs: Reviewed and stable  Complications: No apparent anesthesia complications

## 2013-06-30 ENCOUNTER — Encounter (HOSPITAL_COMMUNITY): Payer: Self-pay | Admitting: Cardiology

## 2013-06-30 ENCOUNTER — Observation Stay (HOSPITAL_COMMUNITY): Payer: No Typology Code available for payment source

## 2013-06-30 DIAGNOSIS — I77810 Thoracic aortic ectasia: Secondary | ICD-10-CM

## 2013-06-30 DIAGNOSIS — R011 Cardiac murmur, unspecified: Secondary | ICD-10-CM

## 2013-06-30 DIAGNOSIS — I059 Rheumatic mitral valve disease, unspecified: Secondary | ICD-10-CM

## 2013-06-30 DIAGNOSIS — K810 Acute cholecystitis: Secondary | ICD-10-CM

## 2013-06-30 DIAGNOSIS — I341 Nonrheumatic mitral (valve) prolapse: Secondary | ICD-10-CM | POA: Diagnosis present

## 2013-06-30 LAB — COMPREHENSIVE METABOLIC PANEL
ALBUMIN: 3.7 g/dL (ref 3.5–5.2)
ALT: 88 U/L — ABNORMAL HIGH (ref 0–53)
AST: 86 U/L — ABNORMAL HIGH (ref 0–37)
Alkaline Phosphatase: 119 U/L — ABNORMAL HIGH (ref 39–117)
BUN: 5 mg/dL — ABNORMAL LOW (ref 6–23)
CALCIUM: 9.3 mg/dL (ref 8.4–10.5)
CO2: 26 mEq/L (ref 19–32)
Chloride: 100 mEq/L (ref 96–112)
Creatinine, Ser: 0.73 mg/dL (ref 0.50–1.35)
GFR calc Af Amer: >90 mL/min (ref >90)
GFR calc non Af Amer: >90 mL/min (ref >90)
GLUCOSE: 120 mg/dL — AB (ref 70–99)
Potassium: 3.8 mEq/L (ref 3.7–5.3)
SODIUM: 140 meq/L (ref 137–147)
TOTAL PROTEIN: 6.6 g/dL (ref 6.0–8.3)
Total Bilirubin: 1.5 mg/dL — ABNORMAL HIGH (ref 0.3–1.2)

## 2013-06-30 LAB — CBC
HCT: 46.5 % (ref 39.0–52.0)
HEMOGLOBIN: 16.1 g/dL (ref 13.0–17.0)
MCH: 30.7 pg (ref 26.0–34.0)
MCHC: 34.6 g/dL (ref 30.0–36.0)
MCV: 88.7 fL (ref 78.0–100.0)
Platelets: 267 10*3/uL (ref 150–400)
RBC: 5.24 MIL/uL (ref 4.22–5.81)
RDW: 13.6 % (ref 11.5–15.5)
WBC: 10.2 10*3/uL (ref 4.0–10.5)

## 2013-06-30 MED ORDER — HYDROMORPHONE HCL PF 1 MG/ML IJ SOLN
1.0000 mg | Freq: Once | INTRAMUSCULAR | Status: AC
  Administered 2013-06-30: 1 mg via INTRAVENOUS
  Filled 2013-06-30: qty 1

## 2013-06-30 MED ORDER — METHOCARBAMOL 500 MG PO TABS
500.0000 mg | ORAL_TABLET | Freq: Three times a day (TID) | ORAL | Status: DC | PRN
  Administered 2013-06-30 (×2): 1000 mg via ORAL
  Administered 2013-07-01: 500 mg via ORAL
  Filled 2013-06-30: qty 1
  Filled 2013-06-30 (×2): qty 2

## 2013-06-30 MED ORDER — HYDROMORPHONE HCL PF 1 MG/ML IJ SOLN
1.0000 mg | INTRAMUSCULAR | Status: DC | PRN
  Administered 2013-06-30: 1 mg via INTRAVENOUS
  Administered 2013-06-30 (×3): 2 mg via INTRAVENOUS
  Administered 2013-06-30 (×2): 1 mg via INTRAVENOUS
  Administered 2013-07-01 (×3): 2 mg via INTRAVENOUS
  Filled 2013-06-30 (×9): qty 2

## 2013-06-30 NOTE — Progress Notes (Signed)
  Echocardiogram 2D Echocardiogram has been performed.  Jonathan Carrillo 06/30/2013, 1:07 PM

## 2013-06-30 NOTE — Progress Notes (Signed)
UR completed. Patient changed to inpatient- con't to require IVF and IV antibiotics

## 2013-06-30 NOTE — Progress Notes (Signed)
Will obtain ECHO to evaluate heart murmur.  Pain improving slowly.  Wilmon ArmsMatthew K. Corliss Skainssuei, MD, [facility]  General/ Trauma Surgery  06/30/2013 12:56 PM

## 2013-06-30 NOTE — Progress Notes (Signed)
Patient ID: [place] Eads, male   DOB: 1970-03-20, 43 y.o.   MRN: [id number] 1 Day Post-Op  Subjective: Pt having pain, had to get a shot of dilaudid just now to help.  Tolerating some liquids.  No nausea.  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  Moving here in June when his kids are out of school.  Objective: Vital signs in last 24 hours: Temp:  [97.8 F (36.6 C)-98.5 F (36.9 C)] 98.2 F (36.8 C) (05/18 0440) Pulse Rate:  [59-75] 70 (05/18 0440) Resp:  [14-24] 16 (05/18 0440) BP: (113-142)/(74-96) 139/78 mmHg (05/18 0440) SpO2:  [91 %-97 %] 96 % (05/18 0440) Last BM Date: 06/28/13  Intake/Output from previous day: 05/17 0701 - 05/18 0700 In: 6240 [P.O.:240; I.V.:6000] Out: 2140 [Urine:2120; Blood:20] Intake/Output this shift:    PE: Abd: soft, appropriately tender, +BS, ND, incisions c/d/i.  Cardiac murmur audible with auscultation to his abdomen Heart: very loud murmur noted. (patient states sometimes it even moves him) Lungs: CTAB  Lab Results:   Recent Labs  06/29/13 0454 06/30/13 0549  WBC 8.9 10.2  HGB 15.5 16.1  HCT 45.1 46.5  PLT 230 267   BMET  Recent Labs  06/29/13 0454 06/30/13 0549  NA 141 140  K 4.0 3.8  CL 103 100  CO2 26 26  GLUCOSE 104* 120*  BUN 6 5*  CREATININE 0.80 0.73  CALCIUM 9.0 9.3   PT/INR No results found for this basename: LABPROT, INR,  in the last 72 hours CMP     Component Value Date/Time   NA 140 06/30/2013 0549   K 3.8 06/30/2013 0549   CL 100 06/30/2013 0549   CO2 26 06/30/2013 0549   GLUCOSE 120* 06/30/2013 0549   BUN 5* 06/30/2013 0549   CREATININE 0.73 06/30/2013 0549   CALCIUM 9.3 06/30/2013 0549   PROT 6.6 06/30/2013 0549   ALBUMIN 3.7 06/30/2013 0549   AST 86* 06/30/2013 0549   ALT 88* 06/30/2013 0549   ALKPHOS 119* 06/30/2013 0549   BILITOT 1.5* 06/30/2013 0549   GFRNONAA >90 06/30/2013 0549   GFRAA >90 06/30/2013 0549   Lipase     Component Value Date/Time   LIPASE 43 06/28/2013 0920        Studies/Results: Dg Cholangiogram Operative  06/29/2013   CLINICAL DATA:  Acute cholecystitis  EXAM: INTRAOPERATIVE CHOLANGIOGRAM  TECHNIQUE: Cholangiographic images from the C-arm fluoroscopic device were submitted for interpretation post-operatively. Please see the procedural report for the amount of contrast and the fluoroscopy time utilized.  COMPARISON:  None.  FINDINGS: Contrast fills the biliary tree and duodenum compatible with patency. There are no filling defects in the common bile duct to suggest common bile duct stones.  IMPRESSION: Patent biliary tree.  No definite common bile duct stones.   Electronically Signed   By: Maryclare BeanArt  Hoss M.D.   On: 06/29/2013 14:20   [place] Abdomen Complete  06/28/2013   CLINICAL DATA:  Abdominal pain.  EXAM: ULTRASOUND ABDOMEN COMPLETE  COMPARISON:  None.  FINDINGS: Gallbladder:  The gallbladder is distended by mid level echoes consistent with sludge. There are also admixed shadowing gallstones. No wall thickening or focal tenderness per sonographer exam.  Common bile duct:  Diameter: 6 mm.  Where visualized, no filling defect.  Liver:  No focal lesion identified. Within normal limits in parenchymal echogenicity.  IVC:  No abnormality visualized.  Pancreas:  Visualized portion unremarkable (the splenic vein does not fill with color Doppler, but neither  does the neighboring aorta or left renal vein - consistent with a technical finding.  Spleen:  Upper limits of normal size with 13 cm length and 5 cm thickness. No focal abnormality.  Right Kidney:  Length: 13 cm. Echogenicity within normal limits. No mass or hydronephrosis visualized.  Left Kidney:  Length: 13 cm. Echogenicity within normal limits. No mass or hydronephrosis visualized.  Abdominal aorta:  No aneurysm visualized.  Other findings:  None.  IMPRESSION: Cholelithiasis and gallbladder distention. No focal tenderness or wall thickening to suggest acute cholecystitis.   Electronically Signed   By: Tiburcio PeaJonathan   Watts M.D.   On: 06/28/2013 11:01   Dg Chest Port 1 View  06/28/2013   CLINICAL DATA:  Shortness of breath.  EXAM: PORTABLE CHEST - 1 VIEW  COMPARISON:  None.  FINDINGS: Normal heart size and mediastinal contours. No acute infiltrate or edema. No effusion or pneumothorax. No acute osseous findings. There is mild levo curvature centered at the upper thoracic level, which may be positional.  IMPRESSION: No active disease.   Electronically Signed   By: Tiburcio PeaJonathan  Watts M.D.   On: 06/28/2013 12:22    Anti-infectives: Anti-infectives   Start     Dose/Rate Route Frequency Ordered Stop   06/28/13 1500  piperacillin-tazobactam (ZOSYN) IVPB 3.375 g     3.375 g 12.5 mL/hr over 240 Minutes Intravenous 3 times per day 06/28/13 1408         Assessment/Plan  1. Acute cholecystitis, s/p lap chole POD 1 2. H/o lung nodule 3. Cardiac murmur  Plan: 1. Patient is scheduled to get a 2 view CXR this morning to follow up on his lung nodule 2. He also had a 2D echo ordered.  This murmur is so loud that I would be surprised if MVP is the only source that is causing this. 3. Cont to advance diet as tolerates.  Patient still on zosyn  4. Decrease IVFs to 50cc/hr  LOS: 2 days    Letha CapeKelly E Estrellita Lasky 06/30/2013, 8:14 AM Pager: [phone]

## 2013-06-30 NOTE — Consult Note (Signed)
Reason for Consult: murmur with abnormal Echo   Referring Physician: CCS  No PCP Per Patient Primary Cardiologist:New Dr. Hilty  Jonathan Carrillo is an 43 y.o. male.    Chief Complaint:    HPI: 43 yo male who is in the process of transitioning to [place] from [place], [place], who presented 06/28/13 with acute onset of RUQ abdominal pain, nausea, diarrhea since 0500 that day. He has had similar, but milder episodes over the last several months. No strict relationship to eating. This was the worst episode, by far.  He underwent Lap chole yesterday at 1300 for acute cholecystitis.   It was noted on admit of a loud murmur echo was ordered. MVP see echo report.   EKG SR prob RVH.  Pt has no chest pain, occ palpitations at times.  Past Medical History  Diagnosis Date  . Heart murmur   . Back pain     History reviewed. No pertinent past surgical history.  Family History  Problem Relation Age of Onset  . Clotting disorder Mother   . Heart disease Mother   . Heart disease Sister   . Diabetes Sister    Social History:  reports that he has been smoking.  He does not have any smokeless tobacco history on file. He reports that he drinks alcohol. His drug history is not on file.  Allergies: No Known Allergies  Medications Prior to Admission  Medication Sig Dispense Refill  . gabapentin (NEURONTIN) 300 MG capsule Take 300 mg by mouth 2 (two) times daily as needed. For pain      . ibuprofen (ADVIL,MOTRIN) 600 MG tablet Take 600 mg by mouth 3 (three) times daily as needed. For pain        Results for orders placed during the hospital encounter of 06/28/13 (from the past 48 hour(s))  COMPREHENSIVE METABOLIC PANEL     Status: Abnormal   Collection Time    06/29/13  4:54 AM      Result Value Ref Range   Sodium 141  137 - 147 mEq/L   Potassium 4.0  3.7 - 5.3 mEq/L   Chloride 103  96 - 112 mEq/L   CO2 26  19 - 32 mEq/L   Glucose, Bld 104 (*) 70 - 99 mg/dL   BUN 6  6 - 23 mg/dL   Creatinine, Ser 0.80  0.50 - 1.35 mg/dL   Calcium 9.0  8.4 - 10.5 mg/dL   Total Protein 5.9 (*) 6.0 - 8.3 g/dL   Albumin 3.4 (*) 3.5 - 5.2 g/dL   AST 17  0 - 37 U/L   ALT 16  0 - 53 U/L   Alkaline Phosphatase 93  39 - 117 U/L   Total Bilirubin 1.4 (*) 0.3 - 1.2 mg/dL   GFR calc non Af Amer >90  >90 mL/min   GFR calc Af Amer >90  >90 mL/min   Comment: (NOTE)     The eGFR has been calculated using the CKD EPI equation.     This calculation has not been validated in all clinical situations.     eGFR's persistently <90 mL/min signify possible Chronic Kidney     Disease.  CBC     Status: None   Collection Time    06/29/13  4:54 AM      Result Value Ref Range   WBC 8.9  4.0 - 10.5 K/uL   RBC 5.09  4.22 - 5.81 MIL/uL   Hemoglobin  15.5  13.0 - 17.0 g/dL   HCT 45.1  39.0 - 52.0 %   MCV 88.6  78.0 - 100.0 fL   MCH 30.5  26.0 - 34.0 pg   MCHC 34.4  30.0 - 36.0 g/dL   RDW 13.7  11.5 - 15.5 %   Platelets 230  150 - 400 K/uL  SURGICAL PCR SCREEN     Status: Abnormal   Collection Time    06/29/13 10:36 AM      Result Value Ref Range   MRSA, PCR NEGATIVE  NEGATIVE   Staphylococcus aureus POSITIVE (*) NEGATIVE   Comment:            The Xpert SA Assay (FDA     approved for NASAL specimens     in patients over 14 years of age),     is one component of     a comprehensive surveillance     program.  Test performance has     been validated by [employer] for patients greater     than or equal to 68 year old.     It is not intended     to diagnose infection nor to     guide or monitor treatment.  CBC     Status: None   Collection Time    06/30/13  5:49 AM      Result Value Ref Range   WBC 10.2  4.0 - 10.5 K/uL   RBC 5.24  4.22 - 5.81 MIL/uL   Hemoglobin 16.1  13.0 - 17.0 g/dL   HCT 46.5  39.0 - 52.0 %   MCV 88.7  78.0 - 100.0 fL   MCH 30.7  26.0 - 34.0 pg   MCHC 34.6  30.0 - 36.0 g/dL   RDW 13.6  11.5 - 15.5 %   Platelets 267  150 - 400 K/uL  COMPREHENSIVE METABOLIC PANEL      Status: Abnormal   Collection Time    06/30/13  5:49 AM      Result Value Ref Range   Sodium 140  137 - 147 mEq/L   Potassium 3.8  3.7 - 5.3 mEq/L   Chloride 100  96 - 112 mEq/L   CO2 26  19 - 32 mEq/L   Glucose, Bld 120 (*) 70 - 99 mg/dL   BUN 5 (*) 6 - 23 mg/dL   Creatinine, Ser 0.73  0.50 - 1.35 mg/dL   Calcium 9.3  8.4 - 10.5 mg/dL   Total Protein 6.6  6.0 - 8.3 g/dL   Albumin 3.7  3.5 - 5.2 g/dL   AST 86 (*) 0 - 37 U/L   ALT 88 (*) 0 - 53 U/L   Alkaline Phosphatase 119 (*) 39 - 117 U/L   Total Bilirubin 1.5 (*) 0.3 - 1.2 mg/dL   GFR calc non Af Amer >90  >90 mL/min   GFR calc Af Amer >90  >90 mL/min   Comment: (NOTE)     The eGFR has been calculated using the CKD EPI equation.     This calculation has not been validated in all clinical situations.     eGFR's persistently <90 mL/min signify possible Chronic Kidney     Disease.   Dg Chest 2 View  06/30/2013   CLINICAL DATA:  History of spot on lung.  EXAM: CHEST  2 VIEW  COMPARISON:  06/28/2013  FINDINGS: Lungs are adequately inflated without focal consolidation or effusion. There is no  definite pulmonary nodule/mass. Cardiomediastinal silhouette is within normal. There are surgical clips over the right upper quadrant. Remainder of the exam is unremarkable.  IMPRESSION: No active cardiopulmonary disease.   Electronically Signed   By: Marin Olp M.D.   On: 06/30/2013 08:22   Dg Cholangiogram Operative  06/29/2013   CLINICAL DATA:  Acute cholecystitis  EXAM: INTRAOPERATIVE CHOLANGIOGRAM  TECHNIQUE: Cholangiographic images from the C-arm fluoroscopic device were submitted for interpretation post-operatively. Please see the procedural report for the amount of contrast and the fluoroscopy time utilized.  COMPARISON:  None.  FINDINGS: Contrast fills the biliary tree and duodenum compatible with patency. There are no filling defects in the common bile duct to suggest common bile duct stones.  IMPRESSION: Patent biliary tree.  No definite  common bile duct stones.   Electronically Signed   By: Maryclare Bean M.D.   On: 06/29/2013 14:20   2 D Echo: Study Conclusions  - Left ventricle: The cavity size was normal. Wall thickness was increased in a pattern of mild LVH. Systolic function was normal. The estimated ejection fraction was in the range of 50% to 55%. Wall motion was normal; there were no regional wall motion abnormalities. Doppler parameters are consistent with abnormal left ventricular relaxation (grade 1 diastolic dysfunction). - Aortic valve: Trileaflet. There was no significant regurgitation. - Aorta: Appears to be some effacement of the sinotubular junction, although aorta not well seen. Aortic root dimension: 4.5 cm (ED). - Aortic root: The aortic root was moderately dilated. - Mitral valve: Mildly to moderately thickened leaflets. Moderate bileaflet prolapse, anterior greater than posterior. There was at least mild regurgitation directed posteriorly. Not well seen. - Tricuspid valve: There was trivial regurgitation. - Pulmonary arteries: Systolic pressure could not be accurately estimated. - Inferior vena cava: Not well visualized. Unable to estimate CVP. - Pericardium, extracardiac: There was no pericardial effusion.  Impressions:  - Mild LVH with LVEF 25-05%, grade 1 diastolic dysfunction. Mitral leaflets are mildly to moderately thickened and there is moderate bileaflet prolapse, anterior greater than posterior. This is associated with at least mild, posteriorly directed mitral regurgitation, although not well defined overall. A TEE could be considered for further evaluation, if felt to be clinically necessary. There is also moderate dilatation of the aortic root with some effacement of the sinotubular junction. Extent of the aorta is not well seen. Suggest further directed aortic imaging such as CTA or MRA. Trivial tricuspid regurgitation, unable to assess PASP.      ROS: ROS  General:no colds or  fevers, no weight changes Skin:no rashes or ulcers HEENT:no blurred vision, no congestion CV:see HPI PUL:see HPI GI:+ diarrhea, nausea and vomiting on admit no constipation or melena, no indigestion GU:no hematuria, no dysuria MS:no joint pain, no claudication Neuro:no syncope, no lightheadedness Endo:no diabetes, no thyroid disease  Blood pressure 131/74, pulse 83, temperature 98.2 F (36.8 C), temperature source Oral, resp. rate 18, height 6' 1"  (1.854 m), weight 175 lb (79.379 kg), SpO2 86.00%. PE: General:Pleasant affect, NAD Skin:Warm and dry, brisk capillary refill HEENT:normocephalic, sclera clear, mucus membranes moist Neck:supple, no JVD, no bruits  Heart:S1S2 RRR [id number]/4 systolic mitral murmur with radiation to back and laterally, no gallup, rub or click Lungs:clear without rales, rhonchi, or wheezes LPF:XTKW, + tender at incision site, hypoactive BS, do not palpate liver spleen or masses Ext:no lower ext edema, 2+ pedal pulses, 2+ radial pulses Neuro:alert and oriented, MAE, follows commands, + facial symmetry    Assessment/Plan Active Problems:   Acute cholecystitis,  s/p lap chole   MVP (mitral valve prolapse)- MD to see for further recommendations.     Cecilie Kicks  Nurse Practitioner Certified [place] Pager [phone] or after 5pm or weekends call [phone] 06/30/2013, 9:38 PM

## 2013-06-30 NOTE — Consult Note (Signed)
Pt. Seen and examined. Agree with the NP/PA-C note as written.  43 yo male with recent acute cholecystitis s/p lap chole. He has a loud murmur and an echo shows mild mitral regurgitation and bileaflet mitral valve prolapse. There is effacement of the sinotubular junction and the aortic root is mildly dilated, however, no significant AI is reported. He tolerated surgery without complications.  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 He has dislocated patellas in the past as well - his wrist and elbow joints are hyperextensible. This is certainly concerning for connective tissue disorder.  I would recommend a baseline CT Aortogram to evaluate the extent of his aortopathy prior to discharge. If there are no clear surgical indications, he could follow-up with me in the office as an outpatient. He is planning on a permanent move to [place].  Thanks for the consult.  Chrystie NoseKenneth C. Veer Elamin, MD, [facility] Attending Cardiologist [facility]

## 2013-07-01 ENCOUNTER — Inpatient Hospital Stay (HOSPITAL_COMMUNITY): Payer: No Typology Code available for payment source

## 2013-07-01 ENCOUNTER — Encounter (HOSPITAL_COMMUNITY): Payer: Self-pay | Admitting: Radiology

## 2013-07-01 LAB — COMPREHENSIVE METABOLIC PANEL
ALK PHOS: 117 U/L (ref 39–117)
ALT: 89 U/L — AB (ref 0–53)
AST: 50 U/L — AB (ref 0–37)
Albumin: 3.5 g/dL (ref 3.5–5.2)
BILIRUBIN TOTAL: 1.3 mg/dL — AB (ref 0.3–1.2)
BUN: 5 mg/dL — ABNORMAL LOW (ref 6–23)
CHLORIDE: 98 meq/L (ref 96–112)
CO2: 27 meq/L (ref 19–32)
Calcium: 9.2 mg/dL (ref 8.4–10.5)
Creatinine, Ser: 0.68 mg/dL (ref 0.50–1.35)
GFR calc Af Amer: >90 mL/min (ref >90)
GLUCOSE: 102 mg/dL — AB (ref 70–99)
Potassium: 4.2 mEq/L (ref 3.7–5.3)
SODIUM: 137 meq/L (ref 137–147)
Total Protein: 6.6 g/dL (ref 6.0–8.3)

## 2013-07-01 MED ORDER — KETOROLAC TROMETHAMINE 30 MG/ML IJ SOLN
30.0000 mg | Freq: Three times a day (TID) | INTRAMUSCULAR | Status: DC | PRN
  Administered 2013-07-01 (×2): 30 mg via INTRAVENOUS
  Filled 2013-07-01 (×2): qty 1

## 2013-07-01 MED ORDER — OXYCODONE-ACETAMINOPHEN 5-325 MG PO TABS: 1.0000 | ORAL_TABLET | ORAL | Status: DC | PRN

## 2013-07-01 MED ORDER — AMOXICILLIN-POT CLAVULANATE 875-125 MG PO TABS
1.0000 | ORAL_TABLET | Freq: Two times a day (BID) | ORAL | Status: DC
  Administered 2013-07-01: 1 via ORAL
  Filled 2013-07-01: qty 1

## 2013-07-01 MED ORDER — OXYCODONE HCL 5 MG PO TABS
5.0000 mg | ORAL_TABLET | ORAL | Status: DC | PRN
  Administered 2013-07-01 (×2): 10 mg via ORAL
  Filled 2013-07-01 (×2): qty 2

## 2013-07-01 MED ORDER — OXYCODONE HCL 5 MG PO TABS: 5.0000 mg | ORAL_TABLET | ORAL | Status: DC | PRN

## 2013-07-01 MED ORDER — IOHEXOL 350 MG/ML SOLN
100.0000 mL | Freq: Once | INTRAVENOUS | Status: AC | PRN
Start: 2013-07-01 — End: 2013-07-01
  Administered 2013-07-01: 100 mL via INTRAVENOUS

## 2013-07-01 MED ORDER — AMOXICILLIN-POT CLAVULANATE 875-125 MG PO TABS: 1.0000 | ORAL_TABLET | Freq: Two times a day (BID) | ORAL | Status: DC

## 2013-07-01 MED ORDER — OXYCODONE-ACETAMINOPHEN 5-325 MG PO TABS
1.0000 | ORAL_TABLET | ORAL | Status: DC | PRN
  Administered 2013-07-01: 2 via ORAL
  Filled 2013-07-01: qty 2

## 2013-07-01 NOTE — Progress Notes (Signed)
Pt sat up in the chair all night. Received dilaudid 2 mg q 2h for pain to mid abdomen. Pt had CT of angio chest and abdomen this am. Pt tolerating clear liquids with frequent urination.

## 2013-07-01 NOTE — Progress Notes (Signed)
LM on scheduling voicemail to schedule appt with Dr. Rennis GoldenHilty per his note's recs. Delancey Moraes PA-C

## 2013-07-01 NOTE — Progress Notes (Signed)
Pt aware of CT at 0500 Tues and to remain NPO for next 4 hours. [facility] request.

## 2013-07-01 NOTE — Discharge Summary (Signed)
Tatiana Courter K. Corliss Skainssuei, MD, [facility]  General/ Trauma Surgery  07/01/2013 8:50 AM

## 2013-07-01 NOTE — Progress Notes (Signed)
Will try PO pain meds. Probable discharge later today.  Jonathan ArmsMatthew K. Corliss Skainssuei, MD, [facility]  General/ Trauma Surgery  07/01/2013 8:49 AM

## 2013-07-01 NOTE — Progress Notes (Signed)
Patient ID: Jonathan Carrillo, male   DOB: 1970-10-25, 43 y.o.   MRN: [id number] 2 Days Post-Op  Subjective: Pt feels ok today.  Still with some pain, but tolerating a solid diet.    Objective: Vital signs in last 24 hours: Temp:  [98 F (36.7 C)-98.6 F (37 C)] 98 F (36.7 C) (05/19 0631) Pulse Rate:  [73-84] 73 (05/19 0631) Resp:  [18-20] 19 (05/19 0631) BP: (127-146)/(74-93) 132/82 mmHg (05/19 0631) SpO2:  [86 %-98 %] 98 % (05/19 0631) Last BM Date: 06/28/13  Intake/Output from previous day: 05/18 0701 - 05/19 0700 In: 987.9 [P.O.:240; I.V.:747.9] Out: 2025 [Urine:2025] Intake/Output this shift:    PE: Abd: soft, tender appropriately tender, incisions c/d/i, +BS  Lab Results:   Recent Labs  06/29/13 0454 06/30/13 0549  WBC 8.9 10.2  HGB 15.5 16.1  HCT 45.1 46.5  PLT 230 267   BMET  Recent Labs  06/30/13 0549 07/01/13 0603  NA 140 137  K 3.8 4.2  CL 100 98  CO2 26 27  GLUCOSE 120* 102*  BUN 5* 5*  CREATININE 0.73 0.68  CALCIUM 9.3 9.2   PT/INR No results found for this basename: LABPROT, INR,  in the last 72 hours CMP     Component Value Date/Time   NA 137 07/01/2013 0603   K 4.2 07/01/2013 0603   CL 98 07/01/2013 0603   CO2 27 07/01/2013 0603   GLUCOSE 102* 07/01/2013 0603   BUN 5* 07/01/2013 0603   CREATININE 0.68 07/01/2013 0603   CALCIUM 9.2 07/01/2013 0603   PROT 6.6 07/01/2013 0603   ALBUMIN 3.5 07/01/2013 0603   AST 50* 07/01/2013 0603   ALT 89* 07/01/2013 0603   ALKPHOS 117 07/01/2013 0603   BILITOT 1.3* 07/01/2013 0603   GFRNONAA >90 07/01/2013 0603   GFRAA >90 07/01/2013 0603   Lipase     Component Value Date/Time   LIPASE 43 06/28/2013 0920       Studies/Results: Dg Chest 2 View  06/30/2013   CLINICAL DATA:  History of spot on lung.  EXAM: CHEST  2 VIEW  COMPARISON:  06/28/2013  FINDINGS: Lungs are adequately inflated without focal consolidation or effusion. There is no definite pulmonary nodule/mass. Cardiomediastinal silhouette is within  normal. There are surgical clips over the right upper quadrant. Remainder of the exam is unremarkable.  IMPRESSION: No active cardiopulmonary disease.   Electronically Signed   By: Elberta Fortisaniel  Boyle M.D.   On: 06/30/2013 08:22   Dg Cholangiogram Operative  06/29/2013   CLINICAL DATA:  Acute cholecystitis  EXAM: INTRAOPERATIVE CHOLANGIOGRAM  TECHNIQUE: Cholangiographic images from the C-arm fluoroscopic device were submitted for interpretation post-operatively. Please see the procedural report for the amount of contrast and the fluoroscopy time utilized.  COMPARISON:  None.  FINDINGS: Contrast fills the biliary tree and duodenum compatible with patency. There are no filling defects in the common bile duct to suggest common bile duct stones.  IMPRESSION: Patent biliary tree.  No definite common bile duct stones.   Electronically Signed   By: Maryclare BeanArt  Hoss M.D.   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 Dilated aortic root.  EXAM: CT ANGIOGRAPHY CHEST AND ABDOMEN  TECHNIQUE: Multidetector CT imaging of the chest and abdomen was performed using the standard protocol during bolus administration of intravenous contrast. Multiplanar CT image reconstructions and MIPs were obtained to evaluate the vascular anatomy.  CONTRAST:  100mL  OMNIPAQUE IOHEXOL 350 MG/ML SOLN  COMPARISON:  No priors.  FINDINGS: CTA CHEST FINDINGS  Mediastinum: Precontrast images demonstrate no crescentic high attenuation associated with the wall of the thoracic aortic to suggest acute intramural hemorrhage. Post-contrast images demonstrate some mild prominence of the sinuses of Valsalva measuring up to approximately 4.3-4.4 cm in diameter on today's non gated study. Probable effacement of the sino-tubular junction. No evidence of thoracic aortic aneurysm or dissection. The ascending thoracic aorta, aortic arch and descending thoracic aorta are normal in caliber  measuring 3.2 cm, 2.7 cm and 2.2 cm in diameter respectively. Heart size appears borderline enlarged, with potential left ventricular dilatation. There is no significant pericardial fluid, thickening or pericardial calcification. No pathologically enlarged mediastinal or hilar lymph nodes. Esophagus is unremarkable in appearance.  Lungs/Pleura: Azygos lobe (normal anatomical variant) incidentally noted. Mild centrilobular emphysema most notably in the lung apices. Mild subsegmental atelectasis in the lower lobes of the lungs bilaterally. No consolidative airspace disease. No pleural effusions. No suspicious appearing pulmonary nodules or masses.  Musculoskeletal: There are no aggressive appearing lytic or blastic lesions noted in the visualized portions of the skeleton.  Review of the MIP images confirms the above findings.  CTA ABDOMEN FINDINGS  Abdomen: Mild atherosclerosis throughout the abdominal vasculature. No evidence of aneurysm or dissection. Single renal arteries bilaterally. Celiac axis, superior mesenteric artery and inferior mesenteric artery are all widely patent.  Postoperative changes of recent cholecystectomy are noted. In the gallbladder fossa, there is some regular gas containing soft tissue and fluid, presumably hemostatic agent applied of recent surgery. Small amount of pneumoperitoneum is within normal limits given the recent surgery. The appearance of the liver, pancreas, spleen, bilateral adrenal glands and bilateral kidneys is unremarkable. Trace volume of ascites tracking in the right pericolic gutter, presumably postoperative. No pathologic distention of small bowel. No lymphadenopathy identified within the abdomen or pelvis. Normal appendix.  Musculoskeletal: There are no aggressive appearing lytic or blastic lesions noted in the visualized portions of the skeleton.  Review of the MIP images confirms the above findings.  IMPRESSION: 1. Although the sinuses of Valsalva appears slightly  prominent, estimated to measure between 43-44 mm in diameter on today's non gated CT examination, the remainder of the thoracic and abdominal aorta are otherwise normal in caliber. There may be effacement of the sino-tubular junction, although this is difficult to assess on a non gated CT examination. No evidence of aortic dissection. 2. Postoperative changes related to recent cholecystectomy, as detailed above. Small amount of pneumoperitoneum is within normal limits in this recently postoperative patient. 3. Left ventricle appears mildly dilated. 4. Mild centrilobular emphysema.   Electronically Signed   By: Trudie Reedaniel  Entrikin M.D.   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 Dilated aortic root.  EXAM: CT ANGIOGRAPHY CHEST AND ABDOMEN  TECHNIQUE: Multidetector CT imaging of the chest and abdomen was performed using the standard protocol during bolus administration of intravenous contrast. Multiplanar CT image reconstructions and MIPs were obtained to evaluate the vascular anatomy.  CONTRAST:  100mL OMNIPAQUE IOHEXOL 350 MG/ML SOLN  COMPARISON:  No priors.  FINDINGS: CTA CHEST FINDINGS  Mediastinum: Precontrast images demonstrate no crescentic high attenuation associated with the wall of the thoracic aortic to suggest acute intramural hemorrhage. Post-contrast images demonstrate some mild prominence of the sinuses of Valsalva measuring up to approximately 4.3-4.4 cm in diameter on today's non gated study. Probable effacement  of the sino-tubular junction. No evidence of thoracic aortic aneurysm or dissection. The ascending thoracic aorta, aortic arch and descending thoracic aorta are normal in caliber measuring 3.2 cm, 2.7 cm and 2.2 cm in diameter respectively. Heart size appears borderline enlarged, with potential left ventricular dilatation. There is no significant pericardial fluid, thickening or pericardial calcification.  No pathologically enlarged mediastinal or hilar lymph nodes. Esophagus is unremarkable in appearance.  Lungs/Pleura: Azygos lobe (normal anatomical variant) incidentally noted. Mild centrilobular emphysema most notably in the lung apices. Mild subsegmental atelectasis in the lower lobes of the lungs bilaterally. No consolidative airspace disease. No pleural effusions. No suspicious appearing pulmonary nodules or masses.  Musculoskeletal: There are no aggressive appearing lytic or blastic lesions noted in the visualized portions of the skeleton.  Review of the MIP images confirms the above findings.  CTA ABDOMEN FINDINGS  Abdomen: Mild atherosclerosis throughout the abdominal vasculature. No evidence of aneurysm or dissection. Single renal arteries bilaterally. Celiac axis, superior mesenteric artery and inferior mesenteric artery are all widely patent.  Postoperative changes of recent cholecystectomy are noted. In the gallbladder fossa, there is some regular gas containing soft tissue and fluid, presumably hemostatic agent applied of recent surgery. Small amount of pneumoperitoneum is within normal limits given the recent surgery. The appearance of the liver, pancreas, spleen, bilateral adrenal glands and bilateral kidneys is unremarkable. Trace volume of ascites tracking in the right pericolic gutter, presumably postoperative. No pathologic distention of small bowel. No lymphadenopathy identified within the abdomen or pelvis. Normal appendix.  Musculoskeletal: There are no aggressive appearing lytic or blastic lesions noted in the visualized portions of the skeleton.  Review of the MIP images confirms the above findings.  IMPRESSION: 1. Although the sinuses of Valsalva appears slightly prominent, estimated to measure between 43-44 mm in diameter on today's non gated CT examination, the remainder of the thoracic and abdominal aorta are otherwise normal in caliber. There may be effacement of the sino-tubular  junction, although this is difficult to assess on a non gated CT examination. No evidence of aortic dissection. 2. Postoperative changes related to recent cholecystectomy, as detailed above. Small amount of pneumoperitoneum is within normal limits in this recently postoperative patient. 3. Left ventricle appears mildly dilated. 4. Mild centrilobular emphysema.   Electronically Signed   By: Trudie Reed M.D.   On: 07/01/2013 05:45    Anti-infectives: Anti-infectives   Start     Dose/Rate Route Frequency Ordered Stop   07/01/13 1000  amoxicillin-clavulanate (AUGMENTIN) 875-125 MG per tablet 1 tablet     1 tablet Oral Every 12 hours 07/01/13 0802     06/28/13 1500  piperacillin-tazobactam (ZOSYN) IVPB 3.375 g  Status:  Discontinued     3.375 g 12.5 mL/hr over 240 Minutes Intravenous 3 times per day 06/28/13 1408 07/01/13 0802       Assessment/Plan  1. POD 2, s/p lap chole 2. MVP  Plan: 1. Appreciate cardiology evaluation of the patient.  We will have him follow up as an outpatient with Dr. Rennis Golden per his note 2. Ok for [employer] home today from our standpoint.  Transition to oral pain meds and toradol/ibuprofen. 3. Follow up with [place] in 3 weeks.   LOS: 3 days    Letha Cape 07/01/2013, 8:13 AM Pager: [phone]

## 2013-07-01 NOTE — Discharge Instructions (Signed)
[facility] ______[facility] °LAPAROSCOPIC SURGERY: POST OP INSTRUCTIONS °Always review your discharge instruction sheet given to you by the facility where your surgery was performed. °IF YOU HAVE DISABILITY OR FAMILY LEAVE FORMS, YOU MUST BRING THEM TO THE OFFICE FOR PROCESSING.   °DO NOT GIVE THEM TO YOUR DOCTOR. ° °1. A prescription for pain medication may be given to you upon discharge.  Take your pain medication as prescribed, if needed.  If narcotic pain medicine is not needed, then you may take acetaminophen (Tylenol) or ibuprofen (Advil) as needed. °2. Take your usually prescribed medications unless otherwise directed. °3. If you need a refill on your pain medication, please contact your pharmacy.  They will contact our office to request authorization. Prescriptions will not be filled after 5pm or on week-ends. °4. You should follow a light diet the first few days after arrival home, such as soup and crackers, etc.  Be sure to include lots of fluids daily. °5. Most patients will experience some swelling and bruising in the area of the incisions.  Ice packs will help.  Swelling and bruising can take several days to resolve.  °6. It is common to experience some constipation if taking pain medication after surgery.  Increasing fluid intake and taking a stool softener (such as Colace) will usually help or prevent this problem from occurring.  A mild laxative (Milk of Magnesia or Miralax) should be taken according to package instructions if there are no bowel movements after 48 hours. °7. Unless discharge instructions indicate otherwise, you may remove your bandages 24-48 hours after surgery, and you may shower at that time.  You may have steri-strips (small skin tapes) in place directly over the incision.  These strips should be left on the skin for 7-10 days.  If your surgeon used skin glue on the incision, you may shower in 24 hours.  The glue will flake off over the next 2-3 weeks.  Any sutures or  staples will be removed at the office during your follow-up visit. °8. ACTIVITIES:  You may resume regular (light) daily activities beginning the next day--such as daily self-care, walking, climbing stairs--gradually increasing activities as tolerated.  You may have sexual intercourse when it is comfortable.  Refrain from any heavy lifting or straining until approved by your doctor. °a. You may drive when you are no longer taking prescription pain medication, you can comfortably wear a seatbelt, and you can safely maneuver your car and apply brakes. °b. RETURN TO WORK:  __________________________________________________________ °9. You should see your doctor in the office for a follow-up appointment approximately 2-3 weeks after your surgery.  Make sure that you call for this appointment within a day or two after you arrive home to insure a convenient appointment time. °10. OTHER INSTRUCTIONS: __________________________________________________________________________________________________________________________ __________________________________________________________________________________________________________________________ °WHEN TO CALL YOUR DOCTOR: °1. Fever over 101.0 °2. Inability to urinate °3. Continued bleeding from incision. °4. Increased pain, redness, or drainage from the incision. °5. Increasing abdominal pain ° °The clinic staff is available to answer your questions during regular business hours.  Please don’t hesitate to call and ask to speak to one of the nurses for clinical concerns.  If you have a medical emergency, go to the nearest emergency room or call 911.  A surgeon from [facility] is always on call at the hospital. °[address], [address], [place], [place]  [address] ? [address] [address], , [place]   [address] °[phone] ? [phone] ? FAX [phone] °Web site:   [email or web address] °

## 2013-07-01 NOTE — Discharge Summary (Signed)
Patient ID: Jonathan Carrillo MRN: [id number] DOB/AGE: 41-May-1972 41 y.o.  Admit date: 06/28/2013 Discharge date: 07/01/2013  Procedures: lap chole with IOC  Consults: cardiology  [place] for Admission: This is a healthy 43 yo male who is in the process [place], who presents with acute onset of RUQ abdominal pain, nausea, diarrhea since 0500 today. He has had similar, but milder episodes over the last several months. No strict relationship to eating. This is the worst episode, by far.   Admission Diagnoses:  1. Acute cholecystitis  Hospital Course: The patient was admitted and taken to the OR the following day where he underwent a lap chole with IOC.  He tolerated the procedure well.  His diet was advanced as tolerated and his pain was controlled with oral pain medications.  He was noted to have a cardiac murmur that was fairly significant.  He had a 2D echo completed.  Due to some concern for MVP and regurg with aortic root dilatation, cardiology was asked to evaluate the patient.  They ordered a CTA to further evaluate the aortic root which appeared ok.  He was recommended to follow up with them as an outpatient.  On POD 2, he was stable for dc home.  Discharge Diagnoses:  Active Problems:   Acute cholecystitis, s/p lap chole   MVP (mitral valve prolapse)   Discharge Medications:   Medication List         amoxicillin-clavulanate 875-125 MG per tablet  Commonly known as:  AUGMENTIN  Take 1 tablet by mouth every 12 (twelve) hours.     gabapentin 300 MG capsule  Commonly known as:  NEURONTIN  Take 300 mg by mouth 2 (two) times daily as needed. For pain     ibuprofen 600 MG tablet  Commonly known as:  ADVIL,MOTRIN  Take 600 mg by mouth 3 (three) times daily as needed. For pain     oxyCODONE-acetaminophen 5-325 MG per tablet  Commonly known as:  PERCOCET/ROXICET  Take 1-2 tablets by mouth every 4 (four) hours as needed for moderate pain.         Discharge Instructions:     Follow-up Information   Follow up with [facility] On 07/22/2013. (2:30pm, arrive at 2:00pm for paperwork)    Contact information:   [address]   [place] [address] [phone]       Follow up with Chrystie NoseHILTY,Kenneth C, MD. Schedule an appointment as soon as possible for a visit in 3 weeks.   Specialty:  Cardiology   Contact information:   [address] [place] [place] [address] [phone]       Signed: Letha CapeKelly E Kenley Troop 07/01/2013, 8:19 AM

## 2013-07-02 ENCOUNTER — Encounter: Payer: Self-pay | Admitting: Internal Medicine

## 2013-07-02 ENCOUNTER — Telehealth: Payer: Self-pay | Admitting: Internal Medicine

## 2013-07-03 NOTE — Telephone Encounter (Signed)
Closed encounter °

## 2013-07-18 ENCOUNTER — Encounter (HOSPITAL_COMMUNITY): Payer: Self-pay | Admitting: General Surgery

## 2013-07-18 NOTE — OR Nursing (Signed)
Late entry to the procedure end time

## 2013-07-22 ENCOUNTER — Ambulatory Visit (INDEPENDENT_AMBULATORY_CARE_PROVIDER_SITE_OTHER): Payer: PRIVATE HEALTH INSURANCE | Admitting: General Surgery

## 2013-07-22 ENCOUNTER — Encounter (INDEPENDENT_AMBULATORY_CARE_PROVIDER_SITE_OTHER): Payer: Self-pay | Admitting: General Surgery

## 2013-07-22 VITALS — BP 112/64 | HR 72 | Temp 98.0°F | Resp 14 | Ht 73.0 in | Wt 171.4 lb

## 2013-07-22 DIAGNOSIS — K8010 Calculus of gallbladder with chronic cholecystitis without obstruction: Secondary | ICD-10-CM

## 2013-07-22 NOTE — Patient Instructions (Signed)
Check with Dr. Rennis Golden, and call us if you have any problems with surgery.

## 2013-07-22 NOTE — Progress Notes (Signed)
Jonathan [facility] April 08, 1970 [id number] 07/22/2013   Jonathan Carrillo is a 43 y.o. male who had a laparoscopic cholecystectomy with intraoperative cholangiogram by Dr.Faera Donell Beers, MD  .  The pathology report confirmed  Diagnosis Gallbladder - CHRONIC CHOLECYSTITIS. - CHOLELITHIASIS PRESENT. - NO TUMOR SEEN..  The patient reports that they are feeling well with normal bowel movements and good appetite.  The pre-operative symptoms of abdominal pain, nausea, and vomiting have resolved.   He has been feeling tired and has off days.  He is scheduled to see Dr. Rennis Golden to follow up on his MVP  Physical examination - Incisions appear well-healed with no sign of infection or bleeding.   Abdomen - soft, non-tender Temp(Src) 98 F (36.7 C) (Oral)  Ht 6\' 1"  (1.854 m)  Wt 77.747 kg (171 lb 6.4 oz)  BMI 22.62 kg/m2 Impression:  s/p laparoscopic cholecystectomy  Plan:Acute cholecystitis, s/p lap chole  MVP (mitral valve prolapse)       He may resume a regular diet and full activity.  He may follow-up on a PRN basis. No issues with surgery.

## 2013-07-24 ENCOUNTER — Ambulatory Visit (INDEPENDENT_AMBULATORY_CARE_PROVIDER_SITE_OTHER): Payer: PRIVATE HEALTH INSURANCE | Admitting: Cardiology

## 2013-07-24 ENCOUNTER — Encounter: Payer: Self-pay | Admitting: Cardiology

## 2013-07-24 VITALS — BP 148/92 | HR 97 | Ht 74.0 in | Wt 171.7 lb

## 2013-07-24 DIAGNOSIS — I059 Rheumatic mitral valve disease, unspecified: Secondary | ICD-10-CM

## 2013-07-24 DIAGNOSIS — I340 Nonrheumatic mitral (valve) insufficiency: Secondary | ICD-10-CM

## 2013-07-24 DIAGNOSIS — R29818 Other symptoms and signs involving the nervous system: Secondary | ICD-10-CM

## 2013-07-24 DIAGNOSIS — R2991 Unspecified symptoms and signs involving the musculoskeletal system: Secondary | ICD-10-CM

## 2013-07-24 DIAGNOSIS — I341 Nonrheumatic mitral (valve) prolapse: Secondary | ICD-10-CM

## 2013-07-24 DIAGNOSIS — R011 Cardiac murmur, unspecified: Secondary | ICD-10-CM

## 2013-07-24 NOTE — Assessment & Plan Note (Signed)
Seen in consult at [facility] after lap chole

## 2013-07-24 NOTE — Progress Notes (Signed)
    07/24/2013 Jonathan Carrillo   Oct 28, 1970  [id number]  Primary Physicia No PCP Per Patient Primary Cardiologist: Dr Rennis Golden  HPI:  43 y/o male with a long history of a "heart murmur". He says he was on beta blockers in the past but he didn't like the side effects- "fatigue". He was recently at [facility] for laparoscopic cholecystectomy when Dr Rennis Golden was asked to see the pt for a murmur.  The pt demonstrated joint hyper flexibility and there was concern he had Marfan's. An echo and chest CTA were obtained- see report.  Essentially no evidence of aortic dissection. His echo showed normal LVF with MVP and mild- moderate MR.    Current Outpatient Prescriptions  Medication Sig Dispense Refill  . ibuprofen (ADVIL,MOTRIN) 600 MG tablet Take 600 mg by mouth 3 (three) times daily as needed. For pain       No current facility-administered medications for this visit.    No Known Allergies  History   Social History  . Marital Status: Married    Spouse Name: N/A    Number of Children: N/A  . Years of Education: N/A   Occupational History  . Not on file.   Social History Main Topics  . Smoking status: Current Every Day Smoker -- 1.00 packs/day for 20 years    Types: Cigarettes  . Smokeless tobacco: Never Used  . Alcohol Use: Yes     Comment: occasional  . Drug Use: No  . Sexual Activity: Not on file   Other Topics Concern  . Not on file   Social History Narrative  . No narrative on file     Review of Systems: General: negative for chills, fever, night sweats or weight changes.  Cardiovascular: negative for chest pain, dyspnea on exertion, edema, orthopnea, palpitations, paroxysmal nocturnal dyspnea or shortness of breath Dermatological: negative for rash Respiratory: negative for cough or wheezing Urologic: negative for hematuria Abdominal: negative for nausea, vomiting, diarrhea, bright red blood per rectum, melena, or hematemesis Neurologic: negative for visual changes, syncope, or  dizziness All other systems reviewed and are otherwise negative except as noted above.    Blood pressure 148/92, pulse 97, height 6\' 2"  (1.88 m), weight 171 lb 11.2 oz (77.883 kg).  General appearance: alert, cooperative, no distress and tall-6'2, thin 172 lbs Neck: no carotid bruit and no JVD Lungs: clear to auscultation bilaterally Heart: regular rate and rhythm and 2-3 systolic murmur LSB Abdomen: soft, non-tender; bowel sounds normal; no masses,  no organomegaly Extremities: hyper flexibility noted in his shoulders Pulses: 2+ and symmetric Skin: Skin color, texture, turgor normal. No rashes or lesions Neurologic: Grossly normal   ASSESSMENT AND PLAN:   Marfanoid habitus with joint hyperflexibility Seen in consult at [facility] after lap chole  MVP (mitral valve prolapse) Moderate by echo  Mitral regurgitation Mild to moderate by echo   PLAN  Reviewed with Dr Rennis Golden- We have referred the pt to Dr A. Modesto Charon at [facility] for possible genetic testing and further evaluation.   [facility] KPA-C 07/24/2013 5:38 PM

## 2013-07-24 NOTE — Assessment & Plan Note (Signed)
Mild to moderate by echo 

## 2013-07-24 NOTE — Assessment & Plan Note (Signed)
Moderate by echo

## 2013-07-24 NOTE — Patient Instructions (Signed)
We will make appt. After luke talks to Dr. Rennis Golden

## 2013-08-05 ENCOUNTER — Ambulatory Visit: Payer: PRIVATE HEALTH INSURANCE | Admitting: Internal Medicine

## 2015-09-20 IMAGING — CR DG CHEST 2V
2 series · 2 of 2 positions shown · non-contrast
Comparison: 06/28/2013

CLINICAL DATA: History of spot on lung.

EXAM:
CHEST  2 VIEW

[w chest pa]
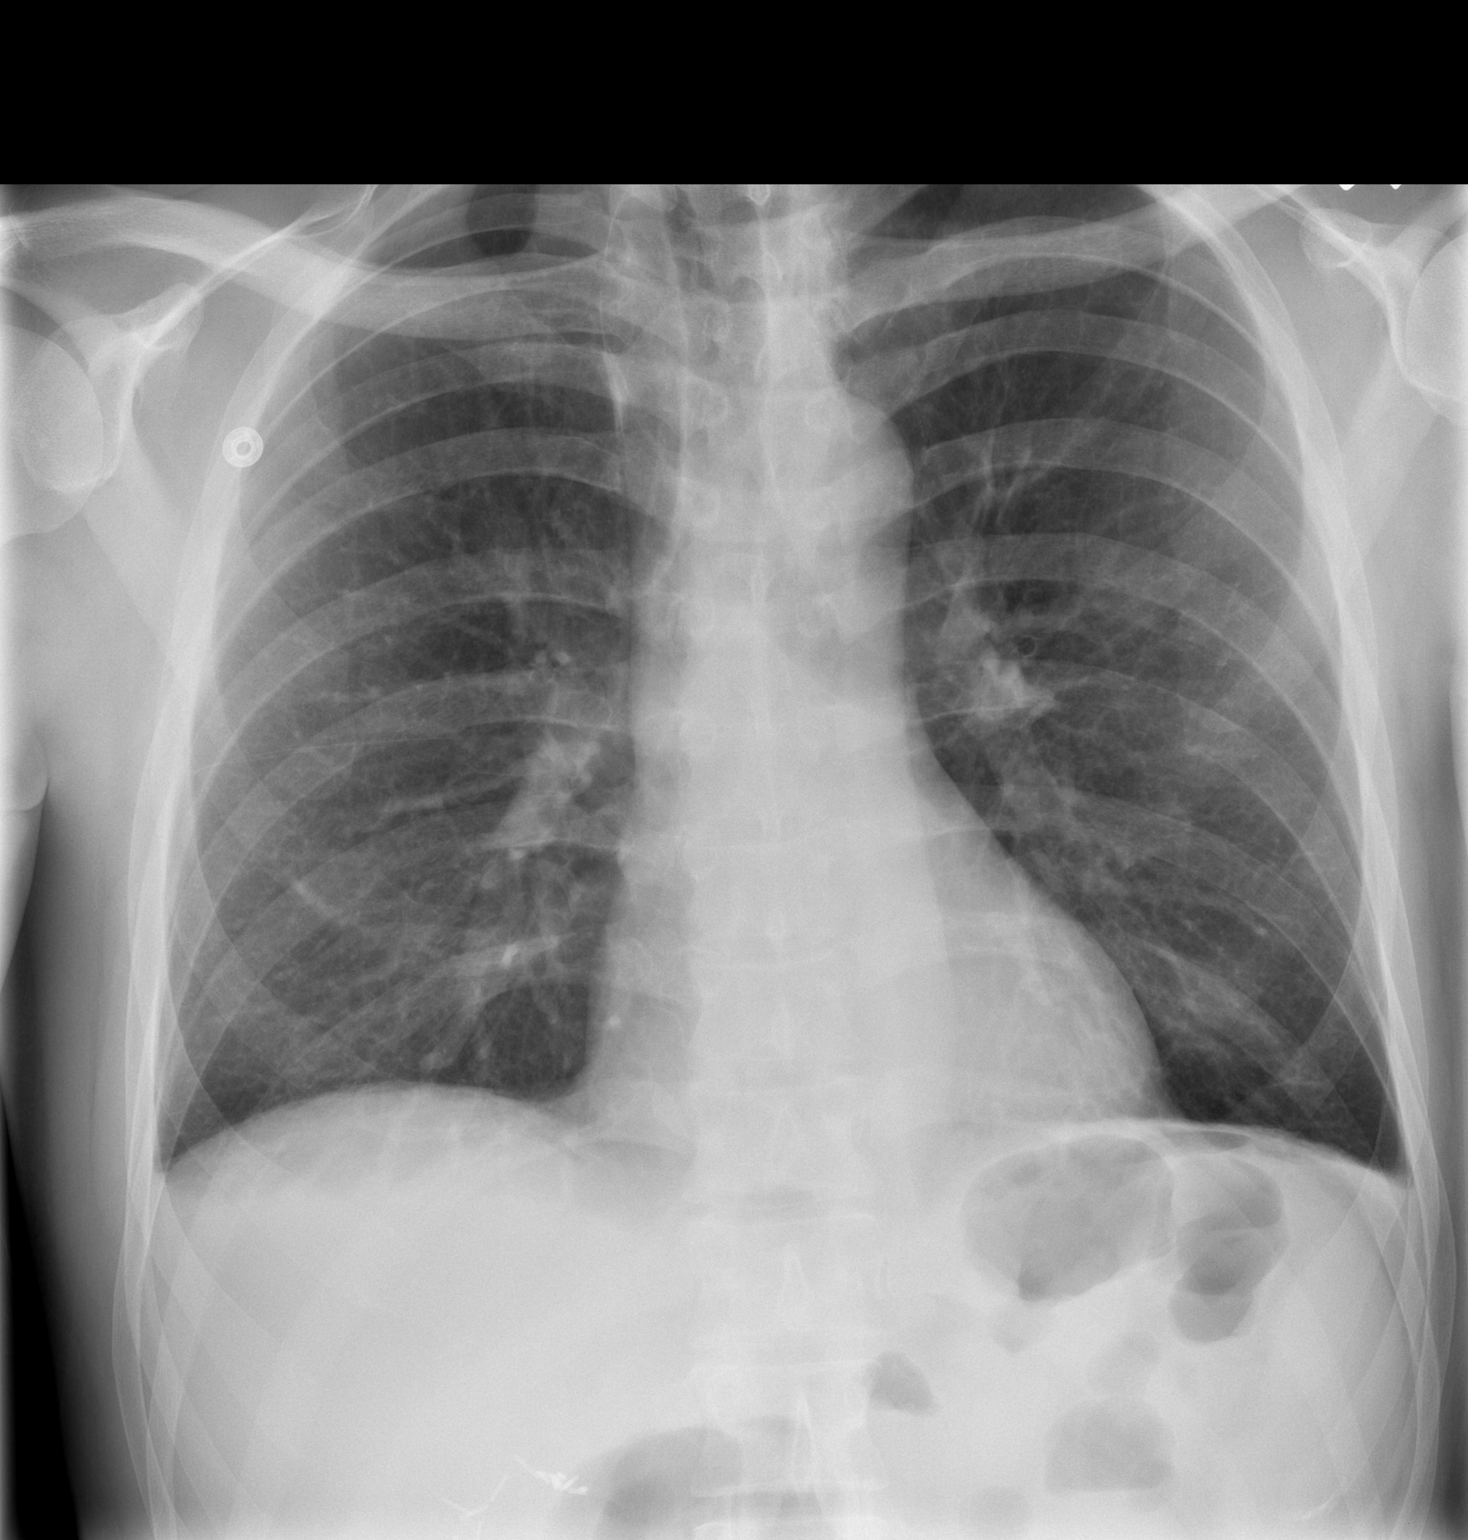

[w chest lat]
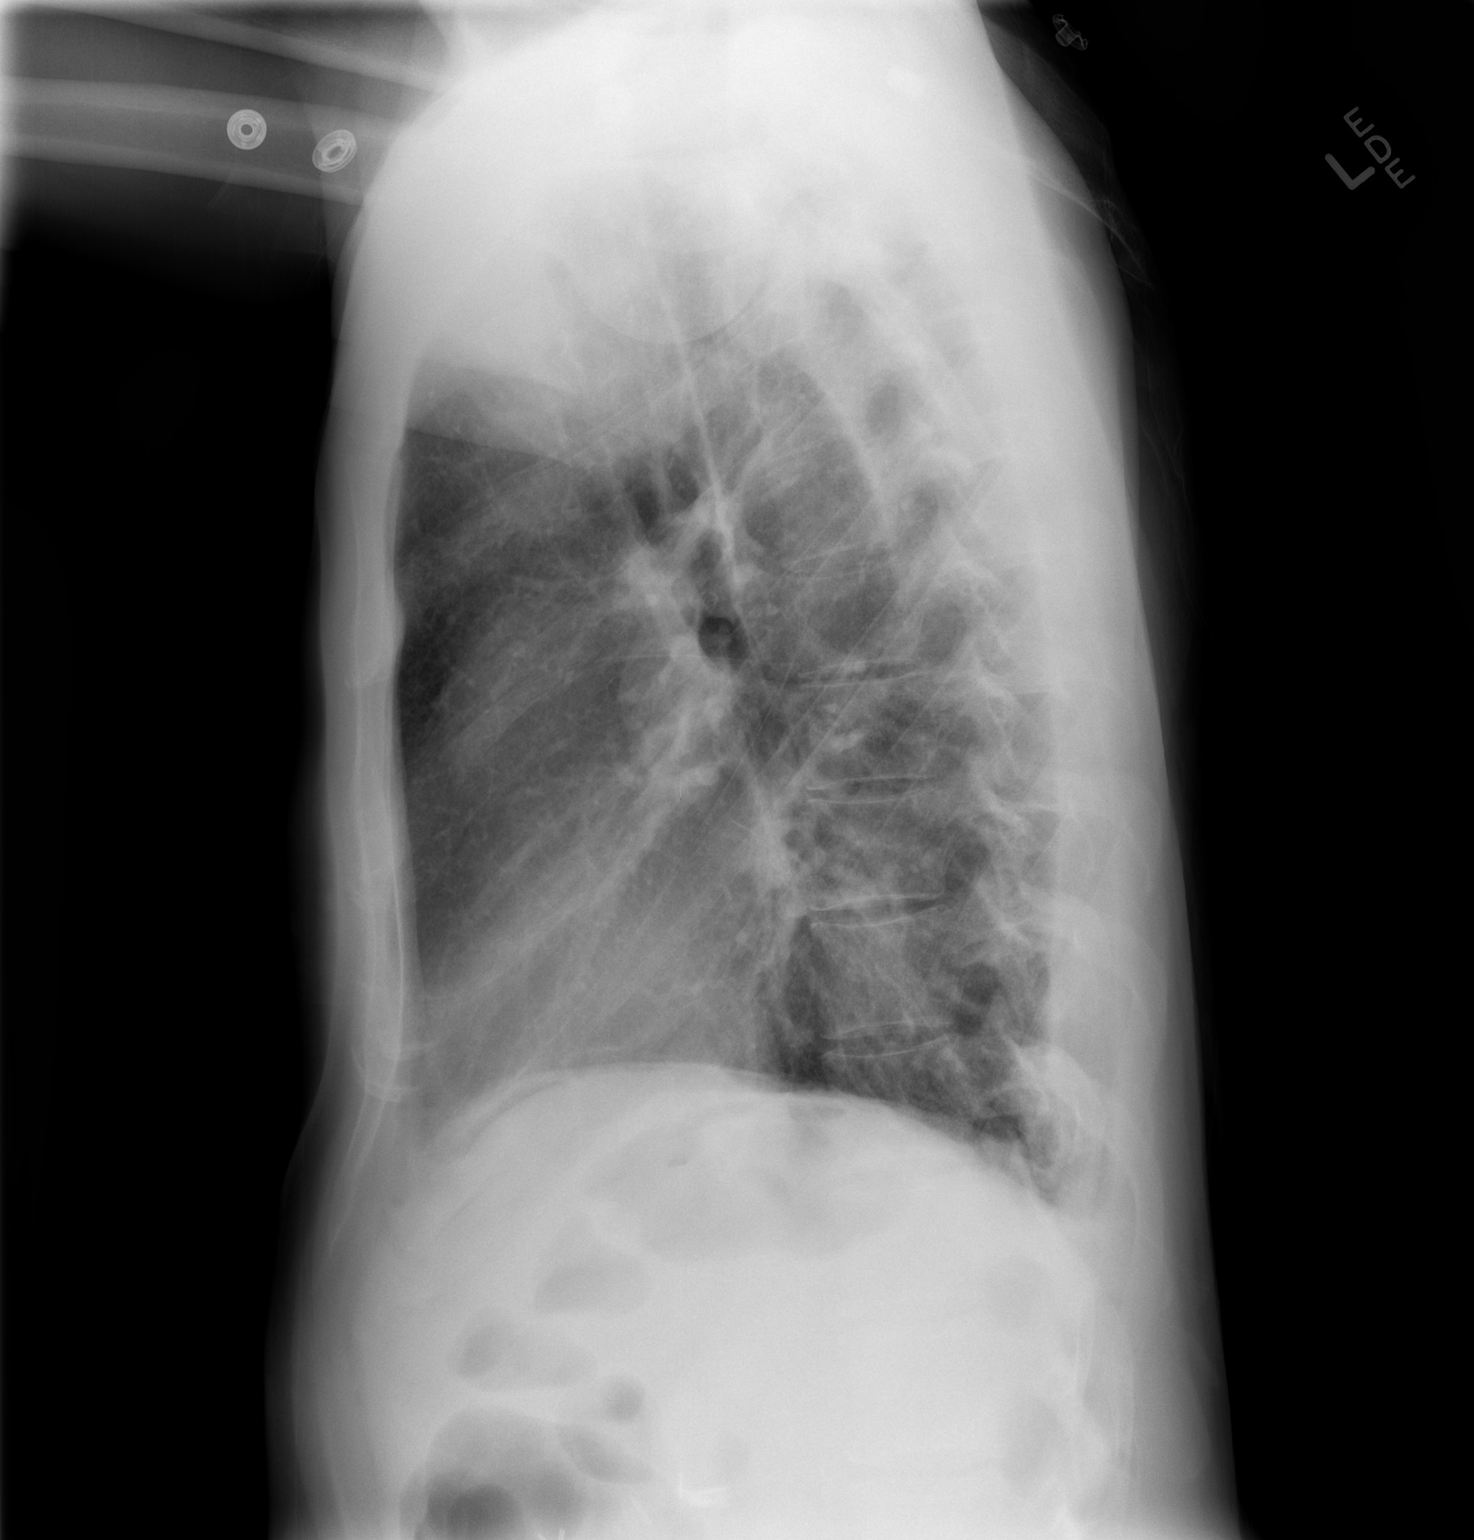

[2 of 2 positions shown; findings below may reference images not displayed]

FINDINGS: Lungs are adequately inflated without focal consolidation or
effusion. There is no definite pulmonary nodule/mass.
Cardiomediastinal silhouette is within normal. There are surgical
clips over the right upper quadrant. Remainder of the exam is
unremarkable.
IMPRESSION: No active cardiopulmonary disease.

## 2015-09-21 IMAGING — CT CT ANGIO CHEST
2 of 9 series · 2 of 36 positions shown · IV contrast (Iodine)
Comparison: No priors.

CLINICAL DATA: Possible connective tissue disorder. Dilated aortic
root.

EXAM:
CT ANGIOGRAPHY CHEST AND ABDOMEN
TECHNIQUE: Multidetector CT imaging of the chest and abdomen was performed
using the standard protocol during bolus administration of
intravenous contrast. Multiplanar CT image reconstructions and MIPs
were obtained to evaluate the vascular anatomy.
CONTRAST:  100mL OMNIPAQUE IOHEXOL 350 MG/ML SOLN

[Series 300: locator · axial · 0.68mm/px · 1 of 1 slices shown]
[im 1/1  lung]
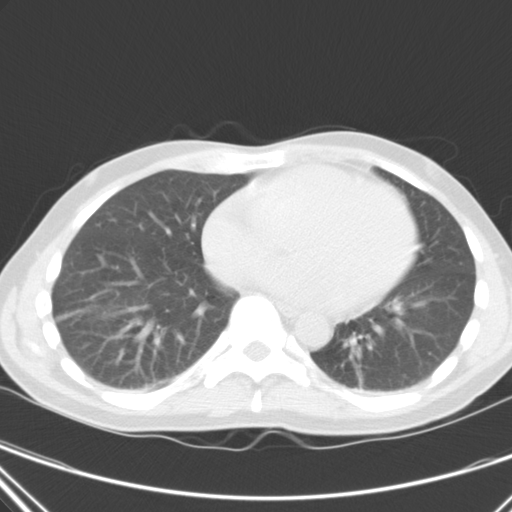

[Series 507: coronals · coronal · 0.45mm/px · 1 of 100 slices shown]
[im 50/100  mediastinal]
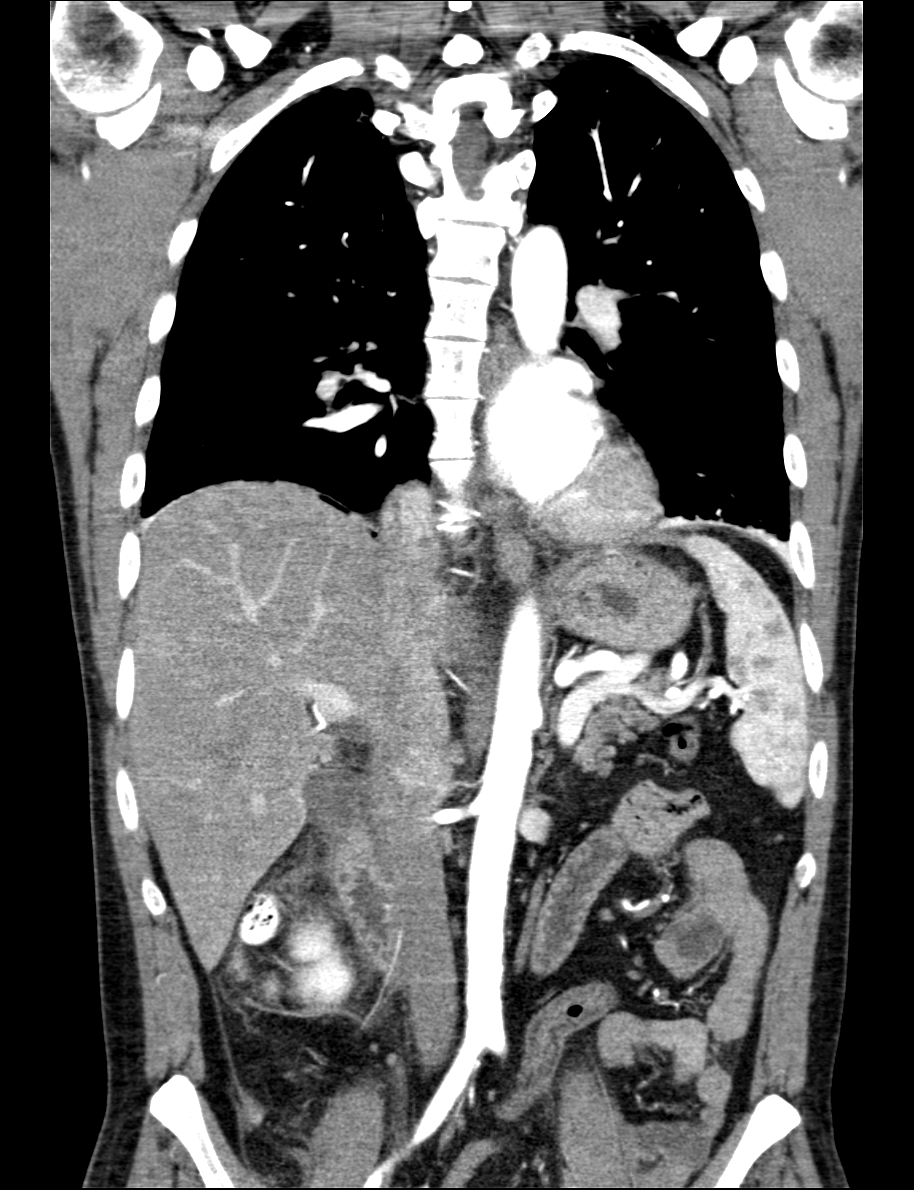

[2 of 36 positions shown; findings below may reference images not displayed]

FINDINGS: CTA CHEST FINDINGS

Mediastinum: Precontrast images demonstrate no crescentic high
attenuation associated with the wall of the thoracic aortic to
suggest acute intramural hemorrhage. Post-contrast images
demonstrate some mild prominence of the sinuses of Valsalva
measuring up to approximately 4.3-4.4 cm in diameter on today's non
gated study. Probable effacement of the Ranvir junction. No
evidence of thoracic aortic aneurysm or dissection. The ascending
thoracic aorta, aortic arch and descending thoracic aorta are normal
in caliber measuring 3.2 cm, 2.7 cm and 2.2 cm in diameter
respectively. Heart size appears borderline enlarged, with potential
left ventricular dilatation. There is no significant pericardial
fluid, thickening or pericardial calcification. No pathologically
enlarged mediastinal or hilar lymph nodes. Esophagus is unremarkable
in appearance.

Lungs/Pleura: Azygos lobe (normal anatomical variant) incidentally
noted. Mild centrilobular emphysema most notably in the lung apices.
Mild subsegmental atelectasis in the lower lobes of the lungs
bilaterally. No consolidative airspace disease. No pleural
effusions. No suspicious appearing pulmonary nodules or masses.

Musculoskeletal: There are no aggressive appearing lytic or blastic
lesions noted in the visualized portions of the skeleton.

Review of the MIP images confirms the above findings.

CTA ABDOMEN FINDINGS

Abdomen: Mild atherosclerosis throughout the abdominal vasculature.
No evidence of aneurysm or dissection. Single renal arteries
bilaterally. Celiac axis, superior mesenteric artery and inferior
mesenteric artery are all widely patent.

Postoperative changes of recent cholecystectomy are noted. In the
gallbladder fossa, there is some regular gas containing soft tissue
and fluid, presumably hemostatic agent applied of recent surgery.
Small amount of pneumoperitoneum is within normal limits given the
recent surgery. The appearance of the liver, pancreas, spleen,
bilateral adrenal glands and bilateral kidneys is unremarkable.
Trace volume of ascites tracking in the right pericolic gutter,
presumably postoperative. No pathologic distention of small bowel.
No lymphadenopathy identified within the abdomen or pelvis. Normal
appendix.

Musculoskeletal: There are no aggressive appearing lytic or blastic
lesions noted in the visualized portions of the skeleton.

Review of the MIP images confirms the above findings.
IMPRESSION: 1. Although the sinuses of Valsalva appears slightly prominent,
estimated to measure between 43-44 mm in diameter on today's non
gated CT examination, the remainder of the thoracic and abdominal
aorta are otherwise normal in caliber. There may be effacement of
the Ranvir junction, although this is difficult to assess on a
non gated CT examination. No evidence of aortic dissection.
2. Postoperative changes related to recent cholecystectomy, as
detailed above. Small amount of pneumoperitoneum is within normal
limits in this recently postoperative patient.
3. Left ventricle appears mildly dilated.
4. Mild centrilobular emphysema.

## 2020-03-16 DEATH — deceased
